# Patient Record
Sex: Male | Born: 1975 | Race: White | Hispanic: No | Marital: Married | Smoking: Never smoker
Health system: Southern US, Community
[De-identification: ages and names within clinical notes are randomized; demographics above are authoritative.]

## PROBLEM LIST (undated history)

## (undated) DIAGNOSIS — G4700 Insomnia, unspecified: Secondary | ICD-10-CM

## (undated) DIAGNOSIS — I10 Essential (primary) hypertension: Secondary | ICD-10-CM

## (undated) DIAGNOSIS — H5440 Blindness, one eye, unspecified eye: Secondary | ICD-10-CM

## (undated) DIAGNOSIS — F909 Attention-deficit hyperactivity disorder, unspecified type: Secondary | ICD-10-CM

## (undated) DIAGNOSIS — K219 Gastro-esophageal reflux disease without esophagitis: Secondary | ICD-10-CM

## (undated) HISTORY — DX: Gastro-esophageal reflux disease without esophagitis: K21.9

## (undated) HISTORY — DX: Insomnia, unspecified: G47.00

## (undated) HISTORY — PX: EYE SURGERY: SHX253

## (undated) HISTORY — DX: Essential (primary) hypertension: I10

## (undated) HISTORY — DX: Attention-deficit hyperactivity disorder, unspecified type: F90.9

---

## 2001-08-03 HISTORY — PX: ANKLE SURGERY: SHX546

## 2011-11-12 ENCOUNTER — Observation Stay: Payer: Self-pay | Admitting: Internal Medicine

## 2011-11-12 LAB — URINALYSIS, COMPLETE
Bacteria: NONE SEEN
Bilirubin,UR: NEGATIVE
Blood: NEGATIVE
Glucose,UR: NEGATIVE mg/dL (ref 0–75)
Nitrite: NEGATIVE
Ph: 6 (ref 4.5–8.0)
Protein: 100

## 2011-11-12 LAB — DRUG SCREEN, URINE
Amphetamines, Ur Screen: NEGATIVE (ref ?–1000)
Barbiturates, Ur Screen: NEGATIVE (ref ?–200)
Benzodiazepine, Ur Scrn: NEGATIVE (ref ?–200)
MDMA (Ecstasy)Ur Screen: NEGATIVE (ref ?–500)
Methadone, Ur Screen: NEGATIVE (ref ?–300)
Phencyclidine (PCP) Ur S: NEGATIVE (ref ?–25)

## 2011-11-12 LAB — ETHANOL
Ethanol %: <0.003 % (ref 0.000–0.080)
Ethanol: <3 mg/dL

## 2011-11-12 LAB — CBC
HCT: 48.1 % (ref 40.0–52.0)
MCH: 27.8 pg (ref 26.0–34.0)
MCHC: 32.9 g/dL (ref 32.0–36.0)
MCV: 84 fL (ref 80–100)
Platelet: 255 10*3/uL (ref 150–440)
RBC: 5.7 10*6/uL (ref 4.40–5.90)
RDW: 15.7 % — ABNORMAL HIGH (ref 11.5–14.5)

## 2011-11-12 LAB — CK TOTAL AND CKMB (NOT AT ARMC)
CK, Total: 465 U/L — ABNORMAL HIGH (ref 35–232)
CK-MB: 2.4 ng/mL (ref 0.5–3.6)

## 2011-11-12 LAB — COMPREHENSIVE METABOLIC PANEL
Albumin: 3.8 g/dL (ref 3.4–5.0)
Alkaline Phosphatase: 46 U/L — ABNORMAL LOW (ref 50–136)
Anion Gap: 13 (ref 7–16)
BUN: 9 mg/dL (ref 7–18)
Bilirubin,Total: 0.5 mg/dL (ref 0.2–1.0)
Calcium, Total: 8.9 mg/dL (ref 8.5–10.1)
Co2: 21 mmol/L (ref 21–32)
EGFR (African American): >60
Osmolality: 275 (ref 275–301)
SGOT(AST): 34 U/L (ref 15–37)
Sodium: 139 mmol/L (ref 136–145)

## 2011-11-12 LAB — TSH: Thyroid Stimulating Horm: <0.01 u[IU]/mL — ABNORMAL LOW

## 2011-11-12 LAB — PROTIME-INR
INR: 0.9
Prothrombin Time: 12.6 secs (ref 11.5–14.7)

## 2011-11-12 LAB — TROPONIN I: Troponin-I: 0.02 ng/mL

## 2011-11-12 LAB — CK-MB: CK-MB: 3.9 ng/mL — ABNORMAL HIGH (ref 0.5–3.6)

## 2011-11-13 LAB — CK-MB: CK-MB: 3.7 ng/mL — ABNORMAL HIGH (ref 0.5–3.6)

## 2011-11-13 LAB — HEMOGLOBIN A1C: Hemoglobin A1C: 4.8 % (ref 4.2–6.3)

## 2011-11-13 LAB — CK: CK, Total: 1087 U/L — ABNORMAL HIGH (ref 35–232)

## 2014-03-12 ENCOUNTER — Emergency Department (HOSPITAL_COMMUNITY): Payer: No Typology Code available for payment source

## 2014-03-12 ENCOUNTER — Emergency Department (HOSPITAL_COMMUNITY)
Admission: EM | Admit: 2014-03-12 | Discharge: 2014-03-12 | Disposition: A | Discharge status: Home / Self Care | Payer: Self-pay | Attending: Emergency Medicine | Admitting: Emergency Medicine

## 2014-03-12 ENCOUNTER — Encounter (HOSPITAL_COMMUNITY): Payer: Self-pay | Admitting: Emergency Medicine

## 2014-03-12 ENCOUNTER — Emergency Department (HOSPITAL_COMMUNITY): Payer: Self-pay

## 2014-03-12 DIAGNOSIS — Y9389 Activity, other specified: Secondary | ICD-10-CM | POA: Insufficient documentation

## 2014-03-12 DIAGNOSIS — H5440 Blindness, one eye, unspecified eye: Secondary | ICD-10-CM | POA: Insufficient documentation

## 2014-03-12 DIAGNOSIS — S199XXA Unspecified injury of neck, initial encounter: Principal | ICD-10-CM

## 2014-03-12 DIAGNOSIS — IMO0002 Reserved for concepts with insufficient information to code with codable children: Secondary | ICD-10-CM | POA: Insufficient documentation

## 2014-03-12 DIAGNOSIS — S0993XA Unspecified injury of face, initial encounter: Secondary | ICD-10-CM | POA: Insufficient documentation

## 2014-03-12 DIAGNOSIS — M542 Cervicalgia: Secondary | ICD-10-CM

## 2014-03-12 DIAGNOSIS — Y92410 Unspecified street and highway as the place of occurrence of the external cause: Secondary | ICD-10-CM | POA: Insufficient documentation

## 2014-03-12 DIAGNOSIS — R1084 Generalized abdominal pain: Secondary | ICD-10-CM | POA: Insufficient documentation

## 2014-03-12 DIAGNOSIS — S3981XA Other specified injuries of abdomen, initial encounter: Secondary | ICD-10-CM | POA: Insufficient documentation

## 2014-03-12 DIAGNOSIS — S0990XA Unspecified injury of head, initial encounter: Secondary | ICD-10-CM | POA: Insufficient documentation

## 2014-03-12 HISTORY — DX: Blindness, one eye, unspecified eye: H54.40

## 2014-03-12 MED ORDER — IBUPROFEN 800 MG PO TABS
800.0000 mg | ORAL_TABLET | Freq: Once | ORAL | Status: AC
  Administered 2014-03-12: 800 mg via ORAL
  Filled 2014-03-12: qty 1

## 2014-03-12 MED ORDER — FENTANYL CITRATE 0.05 MG/ML IJ SOLN
50.0000 ug | INTRAMUSCULAR | Status: DC | PRN
  Administered 2014-03-12: 50 ug via INTRAVENOUS
  Filled 2014-03-12: qty 2

## 2014-03-12 MED ORDER — IOHEXOL 300 MG/ML  SOLN
100.0000 mL | Freq: Once | INTRAMUSCULAR | Status: AC | PRN
  Administered 2014-03-12: 100 mL via INTRAVENOUS

## 2014-03-12 MED ORDER — NAPROXEN 375 MG PO TABS: 375.0000 mg | ORAL_TABLET | Freq: Two times a day (BID) | ORAL | Status: DC

## 2014-03-12 MED ORDER — SODIUM CHLORIDE 0.9 % IV BOLUS (SEPSIS)
1000.0000 mL | Freq: Once | INTRAVENOUS | Status: AC
  Administered 2014-03-12: 1000 mL via INTRAVENOUS

## 2014-03-12 NOTE — Discharge Instructions (Signed)
If you were given medicines take as directed.  If you are on coumadin or contraceptives realize their levels and effectiveness is altered by many different medicines.  If you have any reaction (rash, tongues swelling, other) to the medicines stop taking and see a physician.   Please follow up as directed and return to the ER or see a physician for new or worsening symptoms.  Thank you. Filed Vitals:   03/12/14 1000 03/12/14 1006 03/12/14 1015 03/12/14 1030  BP: 156/91  143/96 159/95  Pulse: 77  73 74  Temp:      TempSrc:      Resp:      SpO2: 96% 98% 94% 95%

## 2014-03-12 NOTE — ED Notes (Signed)
MD at bedside. 

## 2014-03-12 NOTE — ED Notes (Signed)
Patient transported to CT 
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
## 2014-07-29 ENCOUNTER — Encounter (HOSPITAL_COMMUNITY): Payer: Self-pay | Admitting: Emergency Medicine

## 2014-07-29 ENCOUNTER — Emergency Department (HOSPITAL_COMMUNITY)
Admission: EM | Admit: 2014-07-29 | Discharge: 2014-07-29 | Disposition: A | Discharge status: Home / Self Care | Payer: Self-pay | Attending: Emergency Medicine | Admitting: Emergency Medicine

## 2014-07-29 ENCOUNTER — Emergency Department (HOSPITAL_COMMUNITY): Payer: No Typology Code available for payment source

## 2014-07-29 DIAGNOSIS — Z791 Long term (current) use of non-steroidal anti-inflammatories (NSAID): Secondary | ICD-10-CM | POA: Insufficient documentation

## 2014-07-29 DIAGNOSIS — R464 Slowness and poor responsiveness: Secondary | ICD-10-CM | POA: Insufficient documentation

## 2014-07-29 DIAGNOSIS — R05 Cough: Secondary | ICD-10-CM | POA: Insufficient documentation

## 2014-07-29 DIAGNOSIS — H5442 Blindness, left eye, normal vision right eye: Secondary | ICD-10-CM | POA: Insufficient documentation

## 2014-07-29 DIAGNOSIS — R509 Fever, unspecified: Secondary | ICD-10-CM | POA: Insufficient documentation

## 2014-07-29 DIAGNOSIS — Z970 Presence of artificial eye: Secondary | ICD-10-CM | POA: Insufficient documentation

## 2014-07-29 DIAGNOSIS — R109 Unspecified abdominal pain: Secondary | ICD-10-CM | POA: Insufficient documentation

## 2014-07-29 DIAGNOSIS — J3489 Other specified disorders of nose and nasal sinuses: Secondary | ICD-10-CM | POA: Insufficient documentation

## 2014-07-29 DIAGNOSIS — R4189 Other symptoms and signs involving cognitive functions and awareness: Secondary | ICD-10-CM

## 2014-07-29 DIAGNOSIS — R5383 Other fatigue: Secondary | ICD-10-CM | POA: Insufficient documentation

## 2014-07-29 LAB — CBC WITH DIFFERENTIAL/PLATELET
BASOS PCT: 0 % (ref 0–1)
Basophils Absolute: 0 10*3/uL (ref 0.0–0.1)
Eosinophils Absolute: 0.2 10*3/uL (ref 0.0–0.7)
Eosinophils Relative: 1 % (ref 0–5)
HCT: 45.6 % (ref 39.0–52.0)
Hemoglobin: 14.5 g/dL (ref 13.0–17.0)
Lymphocytes Relative: 7 % — ABNORMAL LOW (ref 12–46)
Lymphs Abs: 1 10*3/uL (ref 0.7–4.0)
MCH: 26.1 pg (ref 26.0–34.0)
MCHC: 31.8 g/dL (ref 30.0–36.0)
MCV: 82 fL (ref 78.0–100.0)
Monocytes Absolute: 1.2 10*3/uL — ABNORMAL HIGH (ref 0.1–1.0)
Monocytes Relative: 8 % (ref 3–12)
NEUTROS ABS: 12 10*3/uL — AB (ref 1.7–7.7)
NEUTROS PCT: 84 % — AB (ref 43–77)
Platelets: 345 10*3/uL (ref 150–400)
RBC: 5.56 MIL/uL (ref 4.22–5.81)
RDW: 13.9 % (ref 11.5–15.5)
WBC: 14.4 10*3/uL — ABNORMAL HIGH (ref 4.0–10.5)

## 2014-07-29 LAB — COMPREHENSIVE METABOLIC PANEL
ALT: 20 U/L (ref 0–53)
AST: 32 U/L (ref 0–37)
Albumin: 3.7 g/dL (ref 3.5–5.2)
Alkaline Phosphatase: 42 U/L (ref 39–117)
Anion gap: 8 (ref 5–15)
BUN: 6 mg/dL (ref 6–23)
CHLORIDE: 100 meq/L (ref 96–112)
CO2: 29 mmol/L (ref 19–32)
Calcium: 9.1 mg/dL (ref 8.4–10.5)
Creatinine, Ser: 1.53 mg/dL — ABNORMAL HIGH (ref 0.50–1.35)
GFR, EST AFRICAN AMERICAN: 66 mL/min — AB (ref >90)
GFR, EST NON AFRICAN AMERICAN: 57 mL/min — AB (ref >90)
GLUCOSE: 79 mg/dL (ref 70–99)
Potassium: 3.7 mmol/L (ref 3.5–5.1)
SODIUM: 137 mmol/L (ref 135–145)
Total Bilirubin: 0.2 mg/dL — ABNORMAL LOW (ref 0.3–1.2)
Total Protein: 7.4 g/dL (ref 6.0–8.3)

## 2014-07-29 LAB — ETHANOL: Alcohol, Ethyl (B): <5 mg/dL (ref 0–9)

## 2014-07-29 LAB — SALICYLATE LEVEL: Salicylate Lvl: 6.6 mg/dL (ref 2.8–20.0)

## 2014-07-29 LAB — ACETAMINOPHEN LEVEL

## 2014-07-29 LAB — CBG MONITORING, ED: Glucose-Capillary: 70 mg/dL (ref 70–99)

## 2014-07-29 MED ORDER — NALOXONE HCL 0.4 MG/ML IJ SOLN
0.4000 mg | Freq: Once | INTRAMUSCULAR | Status: AC
  Administered 2014-07-29: 0.4 mg via INTRAVENOUS
  Filled 2014-07-29: qty 1

## 2014-07-29 NOTE — ED Notes (Signed)
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

## 2014-07-29 NOTE — ED Notes (Addendum)
Patient walked to bathroom to try to urinate for sample.

## 2014-07-29 NOTE — ED Notes (Signed)
Patient continues to be unable to give UA.  MD notified.

## 2014-07-29 NOTE — Discharge Instructions (Signed)
Fatigue °Fatigue is a feeling of tiredness, lack of energy, lack of motivation, or feeling tired all the time. Having enough rest, good nutrition, and reducing stress will normally reduce fatigue. Consult your caregiver if it persists. The nature of your fatigue will help your caregiver to find out its cause. The treatment is based on the cause.  °CAUSES  °There are many causes for fatigue. Most of the time, fatigue can be traced to one or more of your habits or routines. Most causes fit into one or more of three general areas. They are: °Lifestyle problems °· Sleep disturbances. °· Overwork. °· Physical exertion. °· Unhealthy habits. °· Poor eating habits or eating disorders. °· Alcohol and/or drug use . °· Lack of proper nutrition (malnutrition). °Psychological problems °· Stress and/or anxiety problems. °· Depression. °· Grief. °· Boredom. °Medical Problems or Conditions °· Anemia. °· Pregnancy. °· Thyroid gland problems. °· Recovery from major surgery. °· Continuous pain. °· Emphysema or asthma that is not well controlled °· Allergic conditions. °· Diabetes. °· Infections (such as mononucleosis). °· Obesity. °· Sleep disorders, such as sleep apnea. °· Heart failure or other heart-related problems. °· Cancer. °· Kidney disease. °· Liver disease. °· Effects of certain medicines such as antihistamines, cough and cold remedies, prescription pain medicines, heart and blood pressure medicines, drugs used for treatment of cancer, and some antidepressants. °SYMPTOMS  °The symptoms of fatigue include:  °· Lack of energy. °· Lack of drive (motivation). °· Drowsiness. °· Feeling of indifference to the surroundings. °DIAGNOSIS  °The details of how you feel help guide your caregiver in finding out what is causing the fatigue. You will be asked about your present and past health condition. It is important to review all medicines that you take, including prescription and non-prescription items. A thorough exam will be done.  You will be questioned about your feelings, habits, and normal lifestyle. Your caregiver may suggest blood tests, urine tests, or other tests to look for common medical causes of fatigue.  °TREATMENT  °Fatigue is treated by correcting the underlying cause. For example, if you have continuous pain or depression, treating these causes will improve how you feel. Similarly, adjusting the dose of certain medicines will help in reducing fatigue.  °HOME CARE INSTRUCTIONS  °· Try to get the required amount of good sleep every night. °· Eat a healthy and nutritious diet, and drink enough water throughout the day. °· Practice ways of relaxing (including yoga or meditation). °· Exercise regularly. °· Make plans to change situations that cause stress. Act on those plans so that stresses decrease over time. Keep your work and personal routine reasonable. °· Avoid street drugs and minimize use of alcohol. °· Start taking a daily multivitamin after consulting your caregiver. °SEEK MEDICAL CARE IF:  °· You have persistent tiredness, which cannot be accounted for. °· You have fever. °· You have unintentional weight loss. °· You have headaches. °· You have disturbed sleep throughout the night. °· You are feeling sad. °· You have constipation. °· You have dry skin. °· You have gained weight. °· You are taking any new or different medicines that you suspect are causing fatigue. °· You are unable to sleep at night. °· You develop any unusual swelling of your legs or other parts of your body. °SEEK IMMEDIATE MEDICAL CARE IF:  °· You are feeling confused. °· Your vision is blurred. °· You feel faint or pass out. °· You develop severe headache. °· You develop severe abdominal, pelvic, or   back pain.  You develop chest pain, shortness of breath, or an irregular or fast heartbeat.  You are unable to pass a normal amount of urine.  You develop abnormal bleeding such as bleeding from the rectum or you vomit blood.  You have thoughts  about harming yourself or committing suicide.  You are worried that you might harm someone else. MAKE SURE YOU:   Understand these instructions.  Will watch your condition.  Will get help right away if you are not doing well or get worse. Document Released: 05/17/2007 Document Revised: 10/12/2011 Document Reviewed: 11/21/2013 [facility] Patient Information 2015 [place], [place]. This information is not intended to replace advice given to you by your health care provider. Make sure you discuss any questions you have with your health care provider.  Drug Toxicity You are having a reaction to your medicine. This does not mean you are allergic to the drug. Medicines can have many different side effects and toxic reactions. These include:  Stomach symptoms, such as nausea, vomiting, cramps, diarrhea, bloating, constipation, and dry mouth.  Nervous system symptoms, such as weakness, muscle spasms, drowsiness, confusion, agitation, and headache.  Heart and blood vessel symptoms, such as fainting, irregular heartbeat (palpitations), and fast heartbeat.  Skin symptoms, such as itching, light sensitivity, and rashes. When taking more medicines, there is an increased chance of a drug interaction that can make you sick. Take your medicines as your caregiver recommends. Keep a list of the names and doses of each of your drugs. Avoid alcohol and street drugs. Call your caregiver if you are worried about drug side effects or toxicity. Document Released: 07/20/2005 Document Revised: 10/12/2011 Document Reviewed: 01/04/2007 [facility] Patient Information 2015 [place], [place]. This information is not intended to replace advice given to you by your health care provider. Make sure you discuss any questions you have with your health care provider.
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
## 2014-07-29 NOTE — ED Provider Notes (Signed)
CSN: [id number]     Arrival date & time 07/29/14  0044 History  This chart was scribed for Audree CamelScott T Antario Yasuda, MD by Abel PrestoKara Demonbreun, ED Scribe. This patient was seen in room [facility] and the patient's care was started at 12:54 AM.    Chief Complaint  Patient presents with  . Abnormal ECG    The history is provided by the patient. No language interpreter was used.   HPI Comments: HPI Comments: Dylan Page is a 38 y.o. male brought in by ambulance, who presents to the Emergency Department complaining of fatigue. Pt states his wife found him on the floor of his closet.  He is not sure how he got there. Pt states he took 0.5 mg of Xanax before bed. Pt notes he has been sick with cough, congestion, rhinorrhea, abdominal pain, and fever between 104-105.  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  Pt denies headaches.  Past Medical History  Diagnosis Date  . Blind left eye    Past Surgical History  Procedure Laterality Date  . Eye surgery     No family history on file. History  Substance Use Topics  . Smoking status: Never Smoker   . Smokeless tobacco: Not on file  . Alcohol Use: No    Review of Systems  Constitutional: Positive for fever.  HENT: Positive for congestion and rhinorrhea.   Respiratory: Positive for cough.   Gastrointestinal: Positive for abdominal pain.  Neurological: Negative for headaches.  All other systems reviewed and are negative.     Allergies  Review of patient's allergies indicates no known allergies.  Home Medications   Prior to Admission medications   Medication Sig Start Date End Date Taking? Authorizing Provider  naproxen (NAPROSYN) 375 MG tablet Take 1 tablet (375 mg total) by mouth 2 (two) times daily. 03/12/14   Enid SkeensJoshua M Zavitz, MD   BP 148/66 mmHg  Pulse 89  Temp(Src) 97.9 F (36.6 C) (Oral)  Resp 18  SpO2 100% Physical Exam  Constitutional: He is oriented to person, place, and time. He appears well-developed and well-nourished. He appears  lethargic.  HENT:  Head: Normocephalic and atraumatic.  Right Ear: External ear normal.  Left Ear: External ear normal.  Nose: Nose normal.  Eyes: EOM are normal. Right pupil is reactive (3 mm).  Left eye is prosthetic  Neck: Normal range of motion. Neck supple.  Cardiovascular: Normal rate, regular rhythm and normal heart sounds.  Exam reveals no friction rub.   No murmur heard. Pulmonary/Chest: Effort normal and breath sounds normal. No respiratory distress. He has no wheezes. He has no rales.  Abdominal: Soft. Bowel sounds are normal. There is no tenderness. There is no rebound.  Musculoskeletal: Normal range of motion.  Neurological: He is oriented to person, place, and time. He appears lethargic.  Normal strength in all 4 extremities. Patient is oriented x 4 when awoken, appears lethargic  Skin: Skin is warm and dry. He is not diaphoretic.  Psychiatric: He has a normal mood and affect. His behavior is normal.  Nursing note and vitals reviewed.   ED Course  Procedures (including critical care time) DIAGNOSTIC STUDIES: Oxygen Saturation is 100% on room air, normal by my interpretation.    COORDINATION OF CARE: 1:00 AM Discussed treatment plan with patient at beside, the patient agrees with the plan and has no further questions at this time.   Labs Review Labs Reviewed  COMPREHENSIVE METABOLIC PANEL - Abnormal; Notable for the following:    Creatinine, Ser  1.53 (*)    Total Bilirubin 0.2 (*)    GFR calc non Af Amer 57 (*)    GFR calc Af Amer 66 (*)    All other components within normal limits  CBC WITH DIFFERENTIAL - Abnormal; Notable for the following:    WBC 14.4 (*)    Neutrophils Relative % 84 (*)    Neutro Abs 12.0 (*)    Lymphocytes Relative 7 (*)    Monocytes Absolute 1.2 (*)    All other components within normal limits  ACETAMINOPHEN LEVEL - Abnormal; Notable for the following:    Acetaminophen (Tylenol), Serum <10.0 (*)    All other components within normal  limits  ETHANOL  SALICYLATE LEVEL  URINALYSIS, ROUTINE W REFLEX MICROSCOPIC  URINE RAPID DRUG SCREEN (HOSP PERFORMED)  CBG MONITORING, ED    Imaging Review Dg Chest Port 1 View  07/29/2014   CLINICAL DATA:  Acute onset of cough, fever, congestion, loss of consciousness and fatigue. Initial encounter.  EXAM: PORTABLE CHEST - 1 VIEW  COMPARISON:  Chest radiograph and CT of the chest performed 03/12/2014  FINDINGS: The lungs are well-aerated. Mild vascular congestion is noted. Minimal bibasilar atelectasis is seen. There is no evidence of pleural effusion or pneumothorax.  The cardiomediastinal silhouette is borderline normal in size. No acute osseous abnormalities are seen.  IMPRESSION: Minimal bibasilar atelectasis seen; mild vascular congestion noted.   Electronically Signed   By: Roanna RaiderJeffery  Chang M.D.   On: 07/29/2014 01:59     EKG Interpretation   Date/Time:  Sunday July 29 2014 00:55:57 EST Ventricular Rate:  96 PR Interval:  165 QRS Duration: 110 QT Interval:  379 QTC Calculation: 479 R Axis:   27 Text Interpretation:  Sinus rhythm Probable left atrial enlargement  Incomplete left bundle branch block Left ventricular hypertrophy Anterior  ST elevation, probably due to LVH Borderline prolonged QT interval No old  tracing to compare Confirmed by Reiley Bertagnolli  MD, Prim Morace ([id number]) on 07/29/2014  1:01:32 AM      MDM   Final diagnoses:  Decreased responsiveness   Patient is protecting airway but lethargic on arrival. EMS concerned about EKG but no signs of STEMI, and patient is not and has not had any chest pain. Wife arrived and stated she was unable to arouse patient which is why she called EMS. Patient has been up a lot recently and ill with URI symptoms, and then took mucinex, xanax and melatonin. His lethargy is likely from polypharmacy in this situation. He also told nurse when I was out of room that he did cocaine 1 day ago, could be coming down from a high. Patient unable to  urinate in ED, he and wife state this is normal for him due to anxiety with urinating with people around. Declines workup for retention or need for catheter. He is more awake here after being observed, and is stable for discharge  I personally performed the services described in this documentation, which was scribed in my presence. The recorded information has been reviewed and is accurate.     Audree CamelScott T Jordain Radin, MD 07/29/14 [phone]

## 2014-07-29 NOTE — ED Notes (Signed)
Patient asked if he could stand to give a urine sample.

## 2014-11-25 NOTE — Consult Note (Signed)
PATIENT NAME:  Dylan Page, Dylan Page MR#:  [id number] DATE OF BIRTH:  02-12-1976  DATE OF CONSULTATION:  11/13/2011  REFERRING PHYSICIAN:  Camillo FlamingKamran Lateef, MD  CONSULTING PHYSICIAN:  A. Wendall MolaMelissa Solum, MD  CHIEF COMPLAINT: Abnormal thyroid function tests.   HISTORY OF PRESENT ILLNESS: This is a 39 year old male who was admitted yesterday after a syncopal episode. 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 he had syncope with preceding symptoms of blurred vision and lightheadedness. He was brought to the ED and found to have stable vital signs. His initial labs were notable for a low TSH of less than 0.010. He had a negative noncontrast head CT and a negative chest x-ray. Additional labs, including a urine drug screen, was negative. He was admitted for observation. Subsequent labs also showed a low free and total T4 with an elevated free T3. He has been taking T3 supplementation for several months. He estimates that he takes 25 to 50 mcg per day. In addition, he takes testosterone IM 800 to 1000 mg 1 to 2 times per week for body building effects.  Neither of these medications are prescribed, rather they are bought from friends. He does intend to stop the thyroid hormone supplementation. Currently he has no complaints. He denies any heart racing or palpitation. He denies any tremor. He denies any heat intolerance. He denies any known history of thyroid disease. He denies any neck pain or discomfort. He denies lightheadedness.   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   PAST SURGICAL HISTORY:  1. Right ankle open reduction internal fixation.  2. Left eye surgery.   OUTPATIENT MEDICATIONS:  1. T3, 25 to 50 mcg per day.  2. Testosterone injections 800 to 1000 mg, 1 to 2 times weekly.  3. Goody Powders, often 4 to 6 daily for headache.  4. Dietary supplements.   ALLERGIES: No known drug allergies.   FAMILY HISTORY: He denies known  family history of thyroid disease.  SOCIAL HISTORY: He is married, has one child. He is employed as a Designer, fashion/clothingroofer. He denies tobacco use. Occasional alcohol use.    REVIEW OF SYSTEMS: GENERAL: No recent weight change. No fever. HEENT: No blurred vision. No sore throat. NECK: No neck pain. No dysphagia. CARDIAC: No chest pain. No palpitations. PULMONARY: No cough, no shortness of breath. ABDOMEN: No abdominal pain. Normal appetite. HEMATOLOGIC: Denies easy bruising or recent bleeding. GU: Denies dysuria or hematuria. ENDOCRINE: Denies heat or cold intolerance. SKIN: Denies rash or pruritus. MUSCULOSKELETAL: Denies weakness. Does complain of chronic back pain. Denies leg swelling.   PHYSICAL EXAMINATION:  VITAL SIGNS: Temperature 98.3, pulse 95, respiratory rate 20, blood pressure 157/102, pulse oximetry 98% on room air.   GENERAL: A quite muscular, tanned, middle-aged male in no distress.   HEENT: Prior left eye surgery with obvious clouding of cornea. No proptosis. Oropharynx is clear.    NECK: Supple. No thyromegaly. Thyroid is palpable and nontender. No palpable thyroid nodules.   CARDIAC: Regular rate and rhythm. No carotid bruit.   PULMONARY: Clear to auscultation bilaterally. No wheeze.   ABDOMEN: Diffusely soft, nontender.   EXTREMITIES: No edema is present. Increased muscular build throughout. Normal motor tone.   SKIN: No rash. Diffuse tattoos are present on the trunk and extremities.   PSYCHIATRIC: Alert and oriented, calm and cooperative.   LABS/STUDIES: Pertinent labs as per history of present illness.   ASSESSMENT:  1. 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  2. Exogenous testosterone abuse.   RECOMMENDATIONS:  1. Do not take T3. I cautioned the patient about significant health risks associated with hyperthyroidism to include potential tachyarrhythmias and heart failure.  2. Recommend he taper down and off the testosterone.   3. He needs to follow regularly with his PCP.  4. He should have repeat thyroid function tests in 4 to 6 weeks to ensure they have normalized.  5. He should discuss with his PCP the need for an assessment for hypogonadism, if indicated, after a period of time completely off testosterone, preferably 2 to 3 months.  6. Hyperthyroidism could have contributed to the episode of syncope, difficult to know;  however, if so, this should not occur if he stops his T3 as recommended.   Thank you for the kind request for consultation.   ____________________________ A. Wendall Mola, MD ams:cbb D: 11/13/2011 15:34:19 ET T: 11/14/2011 12:33:59 ET JOB#: [id number]  cc: A. Wendall Mola, MD, <Dictator> Lucila Maine, MD, [facility] in [place], [place] Macy Mis MD ELECTRONICALLY SIGNED 11/15/2011 12:42

## 2014-11-25 NOTE — Consult Note (Signed)
Consult dictated,Patient is 39 yo male has syncopal episode while climbing roof top, and fell out. His EKG just has sinus tachycardia, mildly elevated troponin due to rhabdomylysis and fall. Advise discharge with f/u me next week monday 10 am.  Electronic Signatures: Radene KneeKhan, Shaukat Ali (MD)  (Signed on 12-Apr-13 08:11)  Authored  Last Updated: 12-Apr-13 08:11 by Radene KneeKhan, Shaukat Ali (MD)

## 2014-11-25 NOTE — Discharge Summary (Signed)
PATIENT NAME:  Dylan Page, Dylan Page MR#:  [id number] DATE OF BIRTH:  [birth year]  DATE OF ADMISSION:  11-28-2011 DATE OF DISCHARGE:  11/13/2011  ADMITTING DIAGNOSIS: Syncope, likely vasovagal in nature.   DISCHARGE DIAGNOSES:  1. Syncope, possibly vasovagal.  2. Elevated troponin.  3. Normal echocardiogram and normal carotid ultrasound.  4. 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  5. Rhabdomyolysis, again likely related to hyperthyroidism.   DISCHARGE CONDITION: Stable.   DISCHARGE MEDICATION: The patient is to resume his outpatient medication which is Zyrtec 10 mg p.o. daily.   ADDITIONAL MEDICATION: Norvasc 5 mg a day.   DIET: 2 grams salt.  Activity Limitations: As tolerated.   DISCHARGE FOLLOWUP: Follow-up with Dr. Esperanza Sheets in 3 days after discharge as well as Dr. Adrian Blackwater in 3 days after discharge.   CONSULTANTS:  1. Adrian Blackwater, MD. 2. Wendall Mola, MD. 3. Social Work.  DISCHARGE INSTRUCTIONS: The patient was also advised to taper off his thyroid medications as well as any other medications he was using over-the-counter.   RADIOLOGIC STUDIES: Chest PA and lateral on 11/28/2011 showed no acute changes.   CT of the head without contrast, on 2011-11-28, showed no acute intracranial process.   Ultrasound of carotid arteries bilaterally, on 11/13/2011, revealed no hemodynamically significant stenosis on either side. No calcific plaque formation on either side. Antegrade flow in both vertebrals was noted.   Echocardiogram on 11-28-11 revealed normal chamber size and function with mild mitral regurgitation as well as mild pulmonary hypertension.   HISTORY OF PRESENT ILLNESS: The patient is a 39 year old Caucasian male with past medical history significant for history of allergies as well as left eye blindness since age 77 who presented to the hospital with complaints of passing out. Please refer to Dr. Doreatha Martin admission note from November 28, 2011.   On arrival  to the hospital, the patient's vitals showed a temperature of 97.6, pulse 104, respiratory rate 18, blood pressure 153/77, and saturation 98% on room air. Physical examination was unremarkable.   LABS/STUDIES: Labs done on November 28, 2011 showed normal BMP. Alcohol less than 3. Liver enzymes were also normal. Cardiac enzymes showed CK total of 465, otherwise MB fraction and troponin on the first were normal. The second set however showed elevation of CK-MB to 3.9 and troponin level was up to 0.19. On the third set, CK total was 1388, MB fraction was 3.7, and troponin was declining to 0.09. The patient's TSH was found to be low at less than 0.01. Urine drug screen was negative. CBC was within normal limits. Coagulation panel was normal. D-dimer was normal at 0.4.   Urinalysis was unremarkable.   HOSPITAL COURSE:  The patient was admitted to the hospital, observation unit. He was evaluated for syncope. He had had an echocardiogram done which was unremarkable. He had also carotid ultrasound, which was normal. It was felt that the patient's syncope could have been vasovagal; however, dehydration, hyperthyroidism related, was not completely ruled out despite the patient's orthostatic vital signs being completely within normal limits on admission to the emergency room. The patient was evaluated by Dr. Adrian Blackwater due to elevation of troponin, on the second set. Dr. Welton Flakes admitted that the patient's EKG showed sinus tachycardia and mildly elevated troponin was very likely due to rhabdomyolysis as well as fall. He recommended to discharge and follow up with Dr. Adrian Blackwater on Monday at 10:00 a.m., in three days after discharge. The patient was also seen by Dr. Tedd Sias due to hyperthyroidism. She felt that  the patient has hyperthyroidism due to abuse of T3 as well as testosterone abuse.  The patient was counseled to stop T3. She recommended to have the patient's thyroid function tests repeated by a primary care physician in  four weeks. She also discussed adverse effects of hyperthyroidism. We counseled the patient to taper down and off testosterone and have testosterone levels checked by a PCP. She also discussed the adverse effects of excessive testosterone. The patient voiced understanding of the issues and made the decision to proceed. On 11/13/2011, he was complaining of back pain and again he had a discussion with the discharging physician about back pain issues. He requested some opioids, however, no opioids were given to him. He was noted to have elevated blood pressure, as high as around 157 systolic and diastolic to 102. He was started on Norvasc for hypertension. He is to follow up with his primary care physician and advance his blood pressure medications as needed to control his blood pressure.   In regards to rhabdomyolysis, the patient was given IV fluids. It was felt that the patient's mild rhabdomyolysis was related to his fall. His maximum level of CK total was at 1388. It decreased with IV fluid administration to 1087 by 11/13/2011. The patient is being discharged in stable condition with the above-mentioned medications and follow-up. His vitals are temperature 98.3, pulse 95, respiration rate 20, blood pressure 155/80, and saturation 98% on room air at rest.   TIME SPENT: 40 minutes. ____________________________ Katharina Caperima Monique Gift, MD rv:slb D: 11/13/2011 17:17:59 ET T: 11/16/2011 09:44:55 ET JOB#: [id number]  cc: Katharina Caperima Delila Kuklinski, MD, <Dictator> Laurier NancyShaukat A. Khan, MD Esperanza SheetsEric Turner, MD Trase Bunda MD ELECTRONICALLY SIGNED 11/24/2011 20:05

## 2014-11-25 NOTE — H&P (Signed)
PATIENT NAME:  Dylan Page, Dylan Page MR#:  [id number] DATE OF BIRTH:  [birth year]  DATE OF ADMISSION:  11/12/2011  REFERRING PHYSICIAN:   Glennie Isle, MD   PRIMARY CARE PHYSICIAN: Dr. Lorin Picket, [facility]  CHIEF COMPLAINT: "I passed out."   HISTORY OF PRESENT ILLNESS: The patient is a pleasant 39 year old male without significant past medical history other than legal left eye blindness since age 67 who presents to the Emergency Department with the above-mentioned chief complaint. The patient works as a Designer, fashion/clothing. He states he was working on the roof yesterday and was sweating excessively and working out in the heat. He states he was not consuming as much fluids as he normally does. He went to work again today and around 9:00 a.m., while walking up a ladder attempting to climb onto the roof, he started to see spots and had some blurry vision and had mild dizziness and lightheadedness. He made it to the roof, and the next thing he remembers he was on his way to the hospital. He experienced a syncopal episode while he was on the roof of unknown duration. He was working with his crew, which consisted of three other members; and coworkers called EMS and the patient was brought to the ER for further evaluation. He denies any preceding diaphoresis or any chest pain or heart palpitations. He denies experiencing similar symptoms in the past. After he came to while he was with Emergency Medical Services, he states he felt a little nauseous and he vomited once on his way to the ER. Currently, he denies any abdominal pain, nausea or vomiting. Currently, he states he feels at his baseline and feels well. He feels as if he may have been a little dehydrated last night. He is currently not orthostatic. He was brought to the ER for further evaluation of syncope. Noncontrast head CT was obtained which was negative for acute intracranial abnormalities. A TSH was checked and was profoundly low at less than 0.010. The  patient admits to taking T3, approximately  25 to 50 mcg per day, and states he cycles on and off every summer and attempts to stay in shape. He has been using it for the past 5 to 6 years. Most recent cycling period he has been taking this for the past couple of months. He also admits to testosterone usage and injects it intramuscularly 2 to 4 times a week, and attempts to body build and lift weights, and has been using it for approximately 10 years on and off. He also admits to taking a supplement from [facility] called OxyElite, which is a dietary supplement pill and apparently is an appetite suppressant, and he takes this once a day. Of note, he also admits to taking Roxicodone once a week and he took it this past Monday. He takes it for back pain, and he also admits to occasional use of Suboxone on and off and last use was approximately a month ago but denies recent usage. All of these medications, he states, he receives from friends and gets these medications off the streets. Of note, he tells me that he does not want his family to know that he is taking these medications (other than T3) and requests that physicians involved in his care do not disclose his drug use to his family. Otherwise, he is without specific complaints at this time. Hospitalist Services were contacted for further evaluation.   PAST SURGICAL HISTORY:  1. Surgery and metal rod insertion into his ankle after a  fracture.  2. History of left eye surgery.  PAST MEDICAL HISTORY:  1. History of right ankle fracture, status post insertion of a metal rod and surgical repair.  2. Legal blindness in the left eye since age 39 after a piece of metal was lodged in his eye, requiring surgery.  ALLERGIES: No known drug allergies.   HOME MEDICATIONS:  1. T3 non prescribed use, takes 25 to 50 mcg per day in attempt to "stay in shape." He has been using it for the past couple of months, and cycles on and off every summer, and has been using it for 5 to  6 years.  2. Intramuscular testosterone injections, 2 to 4 times a week, has been using it on and off for 10 years. This is not prescribed.  3. Goody Powders p.r.n.  4. OxyElite dietary supplement pill from [facility] once a day, used for appetite suppressant purposes.  5. Zyrtec daily.  6. Admits to using Roxicodone once a week, took it this past Monday for back pain, approximately 15 mg per the patient.  7. Suboxone on and off, no recent use. He states he took it about a month ago.   NOTE: None of these medications are prescribed other than Zyrtec.    FAMILY HISTORY: Father is alive and healthy. Mother died of suicide.   SOCIAL HISTORY: Tobacco: None. Alcohol: Occasional on the weekends. Illicit drugs: None. The [place]. He has one child. He works as a Designer, fashion/clothing.   REVIEW OF SYSTEMS: CONSTITUTIONAL: Denies fevers, chills, recent changes in weight or weakness. Denies any pain. HEENT: Had some blurry vision before syncopal event but none currently. He denies tinnitus, earache, nasal discharge, or sore throat. RESPIRATORY: Denies shortness breath, cough, or wheeze. CARDIOVASCULAR: Denies chest pain, heart palpitations or lower extremity edema. GASTROINTESTINAL: Had some nausea and vomited once en route to the ER while with EMS, none since that time. Denies diarrhea, constipation, melena or hematochezia, abdominal pain. GENITOURINARY: Denies dysuria or hematuria. ENDOCRINE: Denies heat or cold intolerance. Denies increased sweating or thirst. Denies any thyroid problems in the past. HEME/LYMPH: Denies easy bruising or bleeding INTEGUMENTARY: Denies rashes or lesions. MUSCULOSKELETAL: Denies joint pain or back pain currently but has occasional back pain, none at this moment. NEUROLOGIC: Denies headache, numbness, weakness, tingling, or dysarthria. PSYCHIATRIC: Denies depression or anxiety.   PHYSICAL EXAMINATION:  VITAL SIGNS: Temperature 97.6, pulse 104, blood  pressure 153/77, respirations 18, oxygen saturation 98% on room air.   GENERAL: The patient is a well-built male, alert and oriented, not in any acute distress.   HEENT: Head: Normocephalic, atraumatic. Eyes: Pupils are equal, round, reactive to light and accommodation. Extraocular muscles are intact. Anicteric sclerae. Conjunctivae pink. Ears: Hearing intact to voice. Nares: Without drainage. Oral mucosa: Moist without lesions.  NECK: Supple with full range of motion. No jugular venous distention, lymphadenopathy, or carotid bruits bilaterally. No thyromegaly or tenderness to palpation over the thyroid gland. No palpable nodules.   CHEST: Normal respiratory effort without use of accessory respiratory muscles.   LUNGS: Clear to auscultation bilaterally without crackles, rales, or wheezes.   CARDIOVASCULAR: S1, S2 positive, tachycardic. No murmurs, rubs, or gallops. PMI is nonlateralized.   ABDOMEN: Soft, nontender, nondistended. Normoactive bowel sounds. No hepatosplenomegaly or palpable masses. No hernias.   EXTREMITIES: No clubbing, cyanosis, or edema. Pedal pulses are palpable bilaterally.  Extremities are non tremulous.  SKIN: No suspicious rashes. Skin turgor is good.   LYMPH: No cervical  lymphadenopathy.   NEUROLOGICAL: The patient is alert and oriented x3. Cranial nerves II through XII are grossly intact.  No focal deficits.  PSYCHIATRIC: Pleasant male with appropriate affect.   LABORATORY, DIAGNOSTIC AND RADIOLOGICAL DATA:  EKG reveals sinus tachycardia at 120 beats per minute, normal axis, normal intervals, nonspecific T wave abnormality. No acute ischemic changes seen. There are no prior EKGs for comparison in our system.  Noncontrast head CT: No acute intracranial abnormalities are noted.  Chest x-ray, PA and lateral: No acute cardiopulmonary abnormalities are noted.  CK 465, CK-MB 2.4. Troponin 0.02.  CBC within normal limits. Serum alcohol less than 0.003%.  Complete  metabolic panel within normal limits.  INR 0.9.  TSH is less than 0.010.  Urinalysis is pending.  Urine drug screen is negative.  D-dimer 0.40.   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 Syncope: Suspect vasovagal in nature. The patient is not orthostatic. There may be a possible element of mild volume depletion as he states he was working on the roof yesterday in the heat, and was sweating profusely and did not drink as much water as he normally does; and thereafter he went to work again today and experienced a brief syncopal episode. Currently, he feels well.  We will admit the patient under observation status. We will place the patient on continuous telemetry monitoring. Cycle his cardiac enzymes. Obtain 2-D echocardiogram and carotid artery Doppler ultrasound. Gently hydrate the patient with IV fluids. Noncontrast head CT was negative for acute intracranial abnormalities. Further work-up and management to follow depending on the patient's clinical course.  2. Hyperthyroidism: Suspect from exogenous T3 use. We will check FT3 and FT4 levels. I advised the patient to stop taking T3 supplement. He is currently tachycardic, and we will consider beta blocker usage. However, we will hold off further work-up or treatment for now until the patient is seen by Endocrinology, and we will obtain a formal Endocrinology consultation for further recommendations.  3. Mild rhabdomyolysis: This could be from fall after syncope versus muscle breakdown from weight lifting. He currently denies any myalgias. We will provide gentle hydration with IV fluids for now and follow CK levels closely.  4. Elevated blood pressure without diagnosed hypertension: This could be stress induced versus hyperthyroidism contributing because he is also somewhat tachycardic. Could consider beta  blocker usage, and we will write for p.r.n. IV labetalol if his blood pressure remains persistently uncontrolled, and we will monitor blood pressure closely.  5. Nonprescribed drug use: The patient was advised that he should only be taking medications that are prescribed to him. He was advised to not take testosterone injections unless these are prescribed to him by a physician. We also advised him to stop taking T3 unless this is prescribed by a physician. He was also advised to stop using OxyElite. He was also advised to not take Roxicodone or Suboxone unless prescribed by  physician. Urine drug screen currently is negative. The patient currently denies any depression or suicidal or homicidal ideation. He states he does not take these medications recreationally. Mostly he takes these medications in order to stay in shape, and he takes the Roxicodone and Suboxone to help with occasional back pain.  6. Deep vein thrombosis prophylaxis: The patient is ambulatory. We will place SCDs to his lower extremities while he is in bed.   CODE STATUS:  FULL CODE.    TIME SPENT ON ADMISSION: Approximately 50 minutes. ____________________________ Elon Alas, MD knl:cbb D: 11/12/2011 16:05:01 ET T: 11/12/2011 17:14:09 ET JOB#: [id number]  cc: Elon Alas, MD, <Dictator> Dr. [facility] [address] Rhayne Chatwin MD ELECTRONICALLY SIGNED 11/24/2011 17:50

## 2014-11-25 NOTE — Consult Note (Signed)
PATIENT NAME:  Dylan Page, Dylan Page MR#:  [id number] DATE OF BIRTH:  12-28-1975  DATE OF CONSULTATION:  11/13/2011  REFERRING PHYSICIAN:   CONSULTING PHYSICIAN:  Laurier NancyShaukat A. Natoya Viscomi, MD  HISTORY OF PRESENT ILLNESS: This is a 39 year old white male with a past medical history of being legally blind in the left eye since age 89 who presented to the Emergency Room with apparently passing out. He said he was on the roof and all of a sudden he started having blurred vision. When he was actually trying to climb the ladder to get to the roof he fell out. He felt lightheaded and dizzy with blurry vision, and then he just all of a sudden passed out. He was brought to the Emergency Room where he had a CT which was unremarkable. He denies prior history of having a seizure disorder. He denied any chest pain.   PAST MEDICAL HISTORY:  1. History of left eye surgery.  2. History of surgery with metal rod insertion in his ankle after a fracture. 3. History of hypertension.   MEDICATIONS:  1. Intramuscular testosterone injections. 2. Goody powders.  4. Zyrtec. 5. Roxicodone.   FAMILY HISTORY: Positive for coronary artery disease.   SOCIAL HISTORY:   He denies tobacco use but occasionally drinks heavily.   PHYSICAL EXAMINATION:  GENERAL: He is alert, oriented times three, in no acute distress.   VITAL SIGNS: Stable.   NECK: No JVD.   LUNGS: Clear.   HEART: Regular rate and rhythm. Normal S1, S2. No audible murmur.   ABDOMEN: Soft, nontender, positive bowel sounds.   EXTREMITIES: No pedal edema   LABORATORY AND DIAGNOSTIC DATA: EKG shows sinus tachycardia, 120 beats per minute. CPK was mildly elevated at 465 and then 1388. Troponin was 0.02 and then it was 0.19 and 0.09.   ASSESSMENT AND PLAN: The patient has syncope, which may be related to dehydration or use of illicit drugs EKG is unremarkable. Mildly elevated troponin is most likely due to rhabdomyolysis due to fall. Etiology of the CT is unclear. May  be new onset seizure disorder. May be related to medications that he takes, but I think he can go home with followup with me in the office next week. Thank you very much for the referral.    ____________________________ Laurier NancyShaukat A. Deonta Bomberger, MD sak:bjt D: 11/13/2011 08:06:32 ET T: 11/13/2011 08:32:51 ET JOB#: [id number]  cc: Laurier NancyShaukat A. Ermie Glendenning, MD, <Dictator> Laurier NancySHAUKAT A [place]il[place] Mcphee MD ELECTRONICALLY SIGNED 11/19/2011 9:04

## 2014-11-25 NOTE — Consult Note (Signed)
Allergies:  No Known Allergies:   Assessment/Plan:   Assessment/Plan 39 yo M admitted with syncope. Patient was found to have incidental low TSH, low free T4 and total T4 and high T3 levels. He was seen and examined. He admits to taking T3 50 mcg daily for the last several months. He plans to stop the T3. He also takes testosterone cypionate [phone] mg IM twice weekly.  A /  1. 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 Testosterone abuse  P/ Counseled patient to stop the T3. Have thyroid function tests respeated by PCP in 4 weeks. Discussed adverse effects of hyperthyroidism.  Counseled patient to taper down and off the testosterone. Have testosterone levels rechecked by PCP. Discussed adverse effects of excessive testosterone.  Full consult will be dictated.   Electronic Signatures: Raj JanusSolum, Anna M (MD)  (Signed 12-Apr-13 14:30)  Authored: ALLERGIES, Assessment/Plan   Last Updated: 12-Apr-13 14:30 by Raj JanusSolum, Anna M (MD)

## 2020-05-30 DIAGNOSIS — R0683 Snoring: Secondary | ICD-10-CM | POA: Diagnosis not present

## 2020-05-30 DIAGNOSIS — I10 Essential (primary) hypertension: Secondary | ICD-10-CM | POA: Diagnosis not present

## 2020-05-30 DIAGNOSIS — F4329 Adjustment disorder with other symptoms: Secondary | ICD-10-CM | POA: Diagnosis not present

## 2020-05-30 DIAGNOSIS — F5102 Adjustment insomnia: Secondary | ICD-10-CM | POA: Diagnosis not present

## 2020-06-18 DIAGNOSIS — K219 Gastro-esophageal reflux disease without esophagitis: Secondary | ICD-10-CM | POA: Diagnosis not present

## 2020-06-18 DIAGNOSIS — Z6832 Body mass index (BMI) 32.0-32.9, adult: Secondary | ICD-10-CM | POA: Diagnosis not present

## 2020-06-18 DIAGNOSIS — F5102 Adjustment insomnia: Secondary | ICD-10-CM | POA: Diagnosis not present

## 2020-06-18 DIAGNOSIS — I10 Essential (primary) hypertension: Secondary | ICD-10-CM | POA: Diagnosis not present

## 2020-07-03 DIAGNOSIS — F5102 Adjustment insomnia: Secondary | ICD-10-CM | POA: Diagnosis not present

## 2020-07-03 DIAGNOSIS — G4733 Obstructive sleep apnea (adult) (pediatric): Secondary | ICD-10-CM | POA: Diagnosis not present

## 2020-07-03 DIAGNOSIS — G2581 Restless legs syndrome: Secondary | ICD-10-CM | POA: Diagnosis not present

## 2020-07-18 DIAGNOSIS — Z6835 Body mass index (BMI) 35.0-35.9, adult: Secondary | ICD-10-CM | POA: Diagnosis not present

## 2020-07-18 DIAGNOSIS — I10 Essential (primary) hypertension: Secondary | ICD-10-CM | POA: Diagnosis not present

## 2020-07-18 DIAGNOSIS — F5102 Adjustment insomnia: Secondary | ICD-10-CM | POA: Diagnosis not present

## 2020-07-18 DIAGNOSIS — F4329 Adjustment disorder with other symptoms: Secondary | ICD-10-CM | POA: Diagnosis not present

## 2020-10-03 DIAGNOSIS — F909 Attention-deficit hyperactivity disorder, unspecified type: Secondary | ICD-10-CM | POA: Diagnosis not present

## 2020-10-03 DIAGNOSIS — F5102 Adjustment insomnia: Secondary | ICD-10-CM | POA: Diagnosis not present

## 2020-10-03 DIAGNOSIS — I10 Essential (primary) hypertension: Secondary | ICD-10-CM | POA: Diagnosis not present

## 2020-10-03 DIAGNOSIS — Z79899 Other long term (current) drug therapy: Secondary | ICD-10-CM | POA: Diagnosis not present

## 2020-10-03 DIAGNOSIS — K219 Gastro-esophageal reflux disease without esophagitis: Secondary | ICD-10-CM | POA: Diagnosis not present

## 2020-10-04 DIAGNOSIS — Z113 Encounter for screening for infections with a predominantly sexual mode of transmission: Secondary | ICD-10-CM | POA: Diagnosis not present

## 2020-10-04 DIAGNOSIS — D509 Iron deficiency anemia, unspecified: Secondary | ICD-10-CM | POA: Diagnosis not present

## 2020-10-04 DIAGNOSIS — D72829 Elevated white blood cell count, unspecified: Secondary | ICD-10-CM | POA: Diagnosis not present

## 2020-10-04 DIAGNOSIS — Z6837 Body mass index (BMI) 37.0-37.9, adult: Secondary | ICD-10-CM | POA: Diagnosis not present

## 2020-10-11 DIAGNOSIS — F909 Attention-deficit hyperactivity disorder, unspecified type: Secondary | ICD-10-CM | POA: Diagnosis not present

## 2020-10-11 DIAGNOSIS — F411 Generalized anxiety disorder: Secondary | ICD-10-CM | POA: Diagnosis not present

## 2020-10-11 DIAGNOSIS — R0902 Hypoxemia: Secondary | ICD-10-CM | POA: Diagnosis not present

## 2020-10-11 DIAGNOSIS — G4700 Insomnia, unspecified: Secondary | ICD-10-CM | POA: Diagnosis not present

## 2020-10-11 DIAGNOSIS — I499 Cardiac arrhythmia, unspecified: Secondary | ICD-10-CM | POA: Diagnosis not present

## 2020-10-11 DIAGNOSIS — R000 Tachycardia, unspecified: Secondary | ICD-10-CM | POA: Diagnosis not present

## 2020-10-11 DIAGNOSIS — D509 Iron deficiency anemia, unspecified: Secondary | ICD-10-CM | POA: Diagnosis not present

## 2020-10-11 DIAGNOSIS — I10 Essential (primary) hypertension: Secondary | ICD-10-CM | POA: Diagnosis not present

## 2020-10-11 DIAGNOSIS — I4891 Unspecified atrial fibrillation: Secondary | ICD-10-CM | POA: Diagnosis not present

## 2020-10-11 HISTORY — DX: Unspecified atrial fibrillation: I48.91

## 2020-10-12 DIAGNOSIS — F411 Generalized anxiety disorder: Secondary | ICD-10-CM | POA: Diagnosis not present

## 2020-10-12 DIAGNOSIS — I4891 Unspecified atrial fibrillation: Secondary | ICD-10-CM | POA: Diagnosis not present

## 2020-10-12 DIAGNOSIS — I10 Essential (primary) hypertension: Secondary | ICD-10-CM | POA: Insufficient documentation

## 2020-10-12 HISTORY — DX: Generalized anxiety disorder: F41.1

## 2020-10-12 HISTORY — DX: Essential (primary) hypertension: I10

## 2020-10-15 DIAGNOSIS — Z7689 Persons encountering health services in other specified circumstances: Secondary | ICD-10-CM | POA: Diagnosis not present

## 2020-10-15 DIAGNOSIS — Z1211 Encounter for screening for malignant neoplasm of colon: Secondary | ICD-10-CM | POA: Diagnosis not present

## 2020-10-15 DIAGNOSIS — I4891 Unspecified atrial fibrillation: Secondary | ICD-10-CM | POA: Diagnosis not present

## 2020-10-15 DIAGNOSIS — Z6837 Body mass index (BMI) 37.0-37.9, adult: Secondary | ICD-10-CM | POA: Diagnosis not present

## 2020-10-16 ENCOUNTER — Encounter: Payer: Self-pay | Admitting: Cardiology

## 2020-10-16 DIAGNOSIS — K219 Gastro-esophageal reflux disease without esophagitis: Secondary | ICD-10-CM | POA: Insufficient documentation

## 2020-10-16 DIAGNOSIS — G4700 Insomnia, unspecified: Secondary | ICD-10-CM | POA: Insufficient documentation

## 2020-10-16 DIAGNOSIS — F909 Attention-deficit hyperactivity disorder, unspecified type: Secondary | ICD-10-CM | POA: Insufficient documentation

## 2020-10-16 DIAGNOSIS — H5440 Blindness, one eye, unspecified eye: Secondary | ICD-10-CM | POA: Insufficient documentation

## 2020-10-16 DIAGNOSIS — I10 Essential (primary) hypertension: Secondary | ICD-10-CM | POA: Insufficient documentation

## 2020-10-23 DIAGNOSIS — I4891 Unspecified atrial fibrillation: Secondary | ICD-10-CM | POA: Diagnosis not present

## 2020-10-28 DIAGNOSIS — F4329 Adjustment disorder with other symptoms: Secondary | ICD-10-CM | POA: Diagnosis not present

## 2020-10-28 DIAGNOSIS — I10 Essential (primary) hypertension: Secondary | ICD-10-CM | POA: Diagnosis not present

## 2020-10-28 DIAGNOSIS — Z6837 Body mass index (BMI) 37.0-37.9, adult: Secondary | ICD-10-CM | POA: Diagnosis not present

## 2020-10-28 DIAGNOSIS — I4891 Unspecified atrial fibrillation: Secondary | ICD-10-CM | POA: Diagnosis not present

## 2020-11-04 ENCOUNTER — Ambulatory Visit: Payer: BC Managed Care – PPO | Admitting: Cardiology

## 2020-11-12 DIAGNOSIS — I10 Essential (primary) hypertension: Secondary | ICD-10-CM | POA: Insufficient documentation

## 2020-11-13 DIAGNOSIS — I4891 Unspecified atrial fibrillation: Secondary | ICD-10-CM | POA: Diagnosis not present

## 2020-11-14 ENCOUNTER — Other Ambulatory Visit: Payer: Self-pay

## 2020-11-14 ENCOUNTER — Encounter: Payer: Self-pay | Admitting: Cardiology

## 2020-11-14 ENCOUNTER — Ambulatory Visit (INDEPENDENT_AMBULATORY_CARE_PROVIDER_SITE_OTHER): Payer: BC Managed Care – PPO | Admitting: Cardiology

## 2020-11-14 VITALS — BP 122/86 | HR 68 | Ht 70.0 in | Wt 267.2 lb

## 2020-11-14 DIAGNOSIS — E669 Obesity, unspecified: Secondary | ICD-10-CM

## 2020-11-14 DIAGNOSIS — R0683 Snoring: Secondary | ICD-10-CM | POA: Insufficient documentation

## 2020-11-14 DIAGNOSIS — I5020 Unspecified systolic (congestive) heart failure: Secondary | ICD-10-CM

## 2020-11-14 DIAGNOSIS — I10 Essential (primary) hypertension: Secondary | ICD-10-CM

## 2020-11-14 DIAGNOSIS — R5383 Other fatigue: Secondary | ICD-10-CM | POA: Insufficient documentation

## 2020-11-14 DIAGNOSIS — R0989 Other specified symptoms and signs involving the circulatory and respiratory systems: Secondary | ICD-10-CM | POA: Diagnosis not present

## 2020-11-14 DIAGNOSIS — I480 Paroxysmal atrial fibrillation: Secondary | ICD-10-CM | POA: Insufficient documentation

## 2020-11-14 DIAGNOSIS — I340 Nonrheumatic mitral (valve) insufficiency: Secondary | ICD-10-CM

## 2020-11-14 HISTORY — DX: Snoring: R06.83

## 2020-11-14 HISTORY — DX: Paroxysmal atrial fibrillation: I48.0

## 2020-11-14 HISTORY — DX: Other fatigue: R53.83

## 2020-11-14 HISTORY — DX: Obesity, unspecified: E66.9

## 2020-11-14 HISTORY — DX: Other specified symptoms and signs involving the circulatory and respiratory systems: R09.89

## 2020-11-14 HISTORY — DX: Unspecified systolic (congestive) heart failure: I50.20

## 2020-11-14 MED ORDER — ENTRESTO 24-26 MG PO TABS: 1.0000 | ORAL_TABLET | Freq: Two times a day (BID) | ORAL | 3 refills | Status: DC

## 2020-11-14 MED ORDER — SPIRONOLACTONE 25 MG PO TABS: 12.5000 mg | ORAL_TABLET | Freq: Every day | ORAL | 3 refills | Status: DC

## 2020-11-14 NOTE — Patient Instructions (Addendum)
Medication Instructions:  Your physician has recommended you make the following change in your medication:  STOP: Lisinopril START: Aldactone 12.5 mg once daily  START: Entresto 24-26 mg twice daily *If you need a refill on your cardiac medications before your next appointment, please call your pharmacy*   Lab Work: Your physician recommends that you return for lab work: TODAY: BMET, Mag If you have labs (blood work) drawn today and your tests are completely normal, you will receive your results only by: [name] MyChart Message (if you have MyChart) OR . A paper copy in the mail If you have any lab test that is abnormal or we need to change your treatment, we will call you to review the results.   Testing/Procedures: Your physician has recommended that you have a sleep study. This test records several body functions during sleep, including: brain activity, eye movement, oxygen and carbon dioxide blood levels, heart rate and rhythm, breathing rate and rhythm, the flow of air through your mouth and nose, snoring, body muscle movements, and chest and belly movement.   Follow-Up: At [facility], you and your health needs are our priority.  As part of our continuing mission to provide you with exceptional heart care, we have created designated Provider Care Teams.  These Care Teams include your primary Cardiologist (physician) and Advanced Practice Providers (APPs -  Physician Assistants and Nurse Practitioners) who all work together to provide you with the care you need, when you need it.  We recommend signing up for the patient portal called "MyChart".  Sign up information is provided on this After Visit Summary.  MyChart is used to connect with patients for Virtual Visits (Telemedicine).  Patients are able to view lab/test results, encounter notes, upcoming appointments, etc.  Non-urgent messages can be sent to your provider as well.   To learn more about what you can do with MyChart, go to  [email or web address].    Your next appointment:   4 week(s)  The format for your next appointment:   In Person  Provider:   Thomasene Ripple, DO   Other Instructions  Stop Lisinopril wait 3 days then start Entresto on Monday.

## 2020-11-14 NOTE — Progress Notes (Signed)
Cardiology Office Note:    Date:  11/14/2020   ID:  Dylan Page, DOB Jun 30, 1976, MRN [id number]  PCP:  Buckner Malta, MD  Cardiologist:  Thomasene Ripple, DO  Electrophysiologist:  None   Referring MD: Buckner Malta, MD     History of Present Illness:    Dylan Page is a 45 y.o. male with a hx of atrial fibrillation on metoprolol succinate as well as Eliquis, hypertension, recently diagnosed depressed ejection fraction on echocardiogram done in March 2022 showed EF of 30 to 35%, presents at the request of his PCP to be seen for his newly diagnosed depressed ejection fraction.  The patient tells me on the day of his admission to [facility] hospital on October 11, 2020 he went to see his PCP he was found to be atrial fibrillation with rapid ventricular rate he was then asked to go to the emergency department.  While in the hospital the patient was started on rate control agents.  And subsequently had an echocardiogram which showed a EF of 30 to 35% with global hypokinesis, mild to moderate mitral regurgitation.   The patient tells me since his hospitalization he has been doing well.  And did recently see his PCP who recommended that he see cardiology.  He denies any chest pain, any lightheadedness, any dizziness.  He admits that he has been fatigued recently and also snores significantly.  He tells me that he also had had a zio  monitor placed by cardiology at [facility] but preferred to follow-up with Conner in the outpatient setting.   Past Medical History:  Diagnosis Date  . ADHD   . Blind left eye   . GERD (gastroesophageal reflux disease)   . Hypertension   . Insomnia     Past Surgical History:  Procedure Laterality Date  . ANKLE SURGERY Left 2003  . EYE SURGERY      Current Medications: Current Meds  Medication Sig  . alprazolam (XANAX) 2 MG tablet Take 2 mg by mouth at bedtime as needed for sleep.  [name] apixaban (ELIQUIS) 5 MG TABS tablet  Take 5 mg by mouth 2 (two) times daily.  [name] aspirin EC 81 MG tablet Take 81 mg by mouth daily. Swallow whole.  [name] atorvastatin (LIPITOR) 40 MG tablet Take 40 mg by mouth daily.  . cetirizine (ZYRTEC) 10 MG tablet Take 10 mg by mouth daily.  . metoprolol succinate (TOPROL-XL) 50 MG 24 hr tablet Take 150 mg by mouth daily. Take 3 tablets daily (150mg ) Take with or immediately following a meal.  . sacubitril-valsartan (ENTRESTO) 24-26 MG Take 1 tablet by mouth 2 (two) times daily.  spironolactone (ALDACTONE) 25 MG tablet Take 0.5 tablets (12.5 mg total) by mouth daily.  . traZODone (DESYREL) 100 MG tablet Take 50 mg by mouth at bedtime. Take 1/2 tablet (50mg ) nightly  . [DISCONTINUED] lisinopril (ZESTRIL) 20 MG tablet Take 20 mg by mouth daily. Takes 2 tablets once a day  . [DISCONTINUED] omeprazole (PRILOSEC) 40 MG capsule Take 40 mg by mouth daily.  . [DISCONTINUED] sertraline (ZOLOFT) 50 MG tablet Take 50 mg by mouth daily.     Allergies:   Patient has no known allergies.   Social History   Socioeconomic History  . Marital status: Unknown    Spouse name: Not on file  . Number of children: Not on file  . Years of education: Not on file  . Highest education level: Not on file  Occupational  History  . Not on file  Tobacco Use  . Smoking status: Never Smoker  . Smokeless tobacco: Never Used  Vaping Use  . Vaping Use: Never used  Substance and Sexual Activity  . Alcohol use: Yes    Comment: Occasional  . Drug use: Never  . Sexual activity: Not on file  Other Topics Concern  . Not on file  Social History Narrative  . Not on file   Social Determinants of Health   Financial Resource Strain: Not on file  Food Insecurity: Not on file  Transportation Needs: Not on file  Physical Activity: Not on file  Stress: Not on file  Social Connections: Not on file     Family History: The patient's family history includes Heart attack in his father; Heart disease in his father.  ROS:    Review of Systems  Constitution: Negative for decreased appetite, fever and weight gain.  HENT: Negative for congestion, ear discharge, hoarse voice and sore throat.   Eyes: Negative for discharge, redness, vision loss in right eye and visual halos.  Cardiovascular: Negative for chest pain, dyspnea on exertion, leg swelling, orthopnea and palpitations.  Respiratory: Negative for cough, hemoptysis, shortness of breath and snoring.   Endocrine: Negative for heat intolerance and polyphagia.  Hematologic/Lymphatic: Negative for bleeding problem. Does not bruise/bleed easily.  Skin: Negative for flushing, nail changes, rash and suspicious lesions.  Musculoskeletal: Negative for arthritis, joint pain, muscle cramps, myalgias, neck pain and stiffness.  Gastrointestinal: Negative for abdominal pain, bowel incontinence, diarrhea and excessive appetite.  Genitourinary: Negative for decreased libido, genital sores and incomplete emptying.  Neurological: Negative for brief paralysis, focal weakness, headaches and loss of balance.  Psychiatric/Behavioral: Negative for altered mental status, depression and suicidal ideas.  Allergic/Immunologic: Negative for HIV exposure and persistent infections.    EKGs/Labs/Other Studies Reviewed:    The following studies were reviewed today:   EKG:  The ekg ordered today demonstrates sinus rhythm, heart rate 68 bpm with T wave abnormalities cannot rule out lateral wall ischemia.  Compared to EKG from the record shown from his PCP office 01 October 2009 patient with atrial fibrillation rapid ventricular rate heart rate 171 bpm.  Transthoracic echocardiogram done in March 2022 There is severe concentric left ventricular hypertrophy with moderate  global hypokinesis and severe hypokinesis of the basal inferoseptal  and basal inferior segments. Considerable improvement in EF in post  PVC beats [post-extrasystolic potentiation], and beat to beat  variation in EF,  suggests improvement in systolic function with rate  control and sinus rhythm restoration. Present EF around 30-35%.  Left ventricular filling pattern and LAP are indeterminate.  The right ventricle is normal size and systolic function.  Mildly dilated atria.  There is mild to moderate mitral regurgitation.  There is mild tricuspid regurgitation.  No pulmonary hypertension.  IVC size was normal.   There is no comparison study available.  -  FINDINGS:  LEFT VENTRICLE  The left ventricular size is normal. There is severe concentric left  ventricular hypertrophy. LV ejection fraction = 30-35%. Left  ventricular systolic function is moderately reduced. Left ventricular  filling pattern is indeterminate. There is moderate global hypokinesis  of the left ventricle.   -  RIGHT VENTRICLE  The right ventricle is normal size. The right ventricular systolic  function is normal.   LEFT ATRIUM  The left atrium is mildly dilated.   RIGHT ATRIUM  The right atrium is mildly dilated.  -  AORTIC VALVE  Structurally normal  aortic valve. There is no aortic stenosis. There  is trace aortic regurgitation.  -  MITRAL VALVE  The mitral valve leaflets appear normal. There is mild to moderate  mitral regurgitation.  -  TRICUSPID VALVE  Structurally normal tricuspid valve. There is mild tricuspid  regurgitation. No pulmonary hypertension.  -  PULMONIC VALVE  The pulmonic valve is not well visualized.  -  ARTERIES  The aortic sinus is normal size.  -  VENOUS  IVC size was normal.  -  EFFUSION  There is no pericardial effusion.  -  -    Recent Labs: No results found for requested labs within last 8760 hours.  Recent Lipid Panel No results found for: CHOL, TRIG, HDL, CHOLHDL, VLDL, LDLCALC, LDLDIRECT  Physical Exam:    VS:  BP 122/86 (BP Location: Right Arm)   Pulse 68   Ht 5\' 10"  (1.778 m)   Wt 267 lb 3.2 oz (121.2 kg)   SpO2 97%   BMI 38.34 kg/m     Wt Readings from Last  3 Encounters:  11/14/20 267 lb 3.2 oz (121.2 kg)  10/11/20 267 lb (121.1 kg)     GEN: Well nourished, well developed in no acute distress HEENT: Normal NECK: No JVD; No carotid bruits LYMPHATICS: No lymphadenopathy CARDIAC: S1S2 noted,RRR, no murmurs, rubs, gallops RESPIRATORY:  Clear to auscultation without rales, wheezing or rhonchi  ABDOMEN: Soft, non-tender, non-distended, +bowel sounds, no guarding. EXTREMITIES: No edema, No cyanosis, no clubbing MUSCULOSKELETAL:  No deformity  SKIN: Warm and dry NEUROLOGIC:  Alert and oriented x 3, non-focal PSYCHIATRIC:  Normal affect, good insight  ASSESSMENT:    1. PAF (paroxysmal atrial fibrillation) (HCC)   2. HFrEF (heart failure with reduced ejection fraction) (HCC)   3. Depressed left ventricular ejection fraction   4. Snoring   5. Fatigue, unspecified type    PLAN:     He is in sinus rhythm today.  He did wear a monitor he tells me so we can get information on that to see his A. fib burden as his hospitalization.  He currently is on metoprolol succinate 150 mg daily which was keeping his rate control for now we will keep him on this.  There are options for rhythm control strategy in this young patient which will include antiarrhythmics as well as pulmonary vein isolation.  ChadsVAsc score is 2 continue patient's Eliquis 5 mg twice a day for now.   First I think we should get him worked up for an ischemic evaluation given his severely depressed ejection fraction.-Given his low probability coronary artery disease I am suspecting that his arrhythmia may have led to his depressed ejection fraction.  In any rate we will going to refer the patient to our heart failure team and will defer to them for the ischemic evaluation.  Reviewed his medication with him.  He is currently on lisinopril which we are going to stop.  I have educated the patient about the washout period for the lisinopril.  Shared decision the patient will stop his lisinopril  for 3 days.  Once he stopped his lisinopril he is, start Aldactone 12.5 mg daily.  And then after 3 days of stopping his lisinopril and starting Aldactone he will start Entresto 24-26 mg twice daily.  We talked about early sleep apnea given his snoring fatigue as well as making sure that sleep apnea is not playing a role in his atrial fibrillation and hypertension.  In addition his STOP-BANG score is 4 which  projects high risk for OSA.  He is agreeable for sleep study we will going order this today.  The patient understands the need to lose weight with diet and exercise. We have discussed specific strategies for this.  Blood work will be done today to assess creatinine as well as electrolytes.  The patient is in agreement with the above plan. The patient left the office in stable condition.  The patient will follow up in 4 weeks due to medication change   Medication Adjustments/Labs and Tests Ordered: Current medicines are reviewed at length with the patient today.  Concerns regarding medicines are outlined above.  Orders Placed This Encounter  Procedures  . Basic metabolic panel  . Magnesium  . AMB referral to CHF clinic  . EKG 12-Lead  . Split night study   Meds ordered this encounter  Medications  . sacubitril-valsartan (ENTRESTO) 24-26 MG    Sig: Take 1 tablet by mouth 2 (two) times daily.    Dispense:  180 tablet    Refill:  3  . spironolactone (ALDACTONE) 25 MG tablet    Sig: Take 0.5 tablets (12.5 mg total) by mouth daily.    Dispense:  45 tablet    Refill:  3    Patient Instructions  Medication Instructions:  Your physician has recommended you make the following change in your medication:  STOP: Lisinopril START: Aldactone 12.5 mg once daily  START: Entresto 24-26 mg twice daily *If you need a refill on your cardiac medications before your next appointment, please call your pharmacy*   Lab Work: Your physician recommends that you return for lab work: TODAY: BMET,  Mag If you have labs (blood work) drawn today and your tests are completely normal, you will receive your results only by: [name]. MyChart Message (if you have MyChart) OR . A paper copy in the mail If you have any lab test that is abnormal or we need to change your treatment, we will call you to review the results.   Testing/Procedures: Your physician has recommended that you have a sleep study. This test records several body functions during sleep, including: brain activity, eye movement, oxygen and carbon dioxide blood levels, heart rate and rhythm, breathing rate and rhythm, the flow of air through your mouth and nose, snoring, body muscle movements, and chest and belly movement.   Follow-Up: At [facility], you and your health needs are our priority.  As part of our continuing mission to provide you with exceptional heart care, we have created designated Provider Care Teams.  These Care Teams include your primary Cardiologist (physician) and Advanced Practice Providers (APPs -  Physician Assistants and Nurse Practitioners) who all work together to provide you with the care you need, when you need it.  We recommend signing up for the patient portal called "MyChart".  Sign up information is provided on this After Visit Summary.  MyChart is used to connect with patients for Virtual Visits (Telemedicine).  Patients are able to view lab/test results, encounter notes, upcoming appointments, etc.  Non-urgent messages can be sent to your provider as well.   To learn more about what you can do with MyChart, go to [email or web address].    Your next appointment:   4 week(s)  The format for your next appointment:   In Person  Provider:   Thomasene RippleKardie Jaila Schellhorn, DO   Other Instructions  Stop Lisinopril wait 3 days then start Entresto on Monday.     Adopting a Healthy Lifestyle.  Know what a  healthy weight is for you (roughly BMI <25) and aim to maintain this   Aim for 7+ servings of fruits and  vegetables daily   65-80+ fluid ounces of water or unsweet tea for healthy kidneys   Limit to max 1 drink of alcohol per day; avoid smoking/tobacco   Limit animal fats in diet for cholesterol and heart health - choose grass fed whenever available   Avoid highly processed foods, and foods high in saturated/trans fats   Aim for low stress - take time to unwind and care for your mental health   Aim for 150 min of moderate intensity exercise weekly for heart health, and weights twice weekly for bone health   Aim for 7-9 hours of sleep daily   When it comes to diets, agreement about the perfect plan isnt easy to find, even among the experts. Experts at the [facility] [employer] developed an idea known as the Healthy Eating Plate. Just imagine a plate divided into logical, healthy portions.   The emphasis is on diet quality:   Load up on vegetables and fruits - one-half of your plate: Aim for color and variety, and remember that potatoes dont count.   Go for whole grains - one-quarter of your plate: Whole wheat, barley, wheat berries, quinoa, oats, brown rice, and foods made with them. If you want pasta, go with whole wheat pasta.   Protein power - one-quarter of your plate: Fish, chicken, beans, and nuts are all healthy, versatile protein sources. Limit red meat.   The diet, however, does go beyond the plate, offering a few other suggestions.   Use healthy plant oils, such as olive, canola, soy, corn, sunflower and peanut. Check the labels, and avoid partially hydrogenated oil, which have unhealthy trans fats.   If youre thirsty, drink water. Coffee and tea are good in moderation, but skip sugary drinks and limit milk and dairy products to one or two daily servings.   The type of carbohydrate in the diet is more important than the amount. Some sources of carbohydrates, such as vegetables, fruits, whole grains, and beans-are healthier than others.   Finally, stay  active  Signed, Thomasene Ripple, DO  11/14/2020 11:03 AM    [facility]

## 2020-11-15 LAB — BASIC METABOLIC PANEL
BUN/Creatinine Ratio: 8 — ABNORMAL LOW (ref 9–20)
BUN: 6 mg/dL (ref 6–24)
CO2: 28 mmol/L (ref 20–29)
Calcium: 9.3 mg/dL (ref 8.7–10.2)
Chloride: 95 mmol/L — ABNORMAL LOW (ref 96–106)
Creatinine, Ser: 0.8 mg/dL (ref 0.76–1.27)
Glucose: 107 mg/dL — ABNORMAL HIGH (ref 65–99)
Potassium: 4 mmol/L (ref 3.5–5.2)
Sodium: 140 mmol/L (ref 134–144)
eGFR: 113 mL/min/{1.73_m2} (ref >59)

## 2020-11-15 LAB — MAGNESIUM: Magnesium: 1.8 mg/dL (ref 1.6–2.3)

## 2020-11-18 ENCOUNTER — Telehealth: Payer: Self-pay | Admitting: Cardiology

## 2020-11-18 NOTE — Telephone Encounter (Signed)
Left message for patient to return our call.

## 2020-11-18 NOTE — Telephone Encounter (Signed)
New message:    Patient calling stating that they are doing everything that has been asked of them. I read the message to the patient. Please call if need to.

## 2020-11-18 NOTE — Telephone Encounter (Signed)
No I do not know what this is about. I was calling him with results earlier but he did not answer.

## 2020-11-19 ENCOUNTER — Telehealth: Payer: Self-pay | Admitting: *Deleted

## 2020-11-19 ENCOUNTER — Telehealth: Payer: Self-pay | Admitting: Cardiology

## 2020-11-19 NOTE — Telephone Encounter (Signed)
Pt c/o medication issue:  1. Name of Medication: sacubitril-valsartan (ENTRESTO) 24-26 MG  2. How are you currently taking this medication (dosage and times per day)? As prescribed   3. Are you having a reaction (difficulty breathing--STAT)? No   4. What is your medication issue?   Patient's daughter states when they went to pick up this medication it was $600. She states they are unable to afford it and they would like to know if the patient is eligible for an alternative or if we are able to provide assistance. She states with Eliquis he was given a card at the hospital that made his Eliquis only $10/month. She states she is hoping to find similar assistance with the [facility] and is currently looking for a coupon online.

## 2020-11-19 NOTE — Telephone Encounter (Signed)
Staff message sent to Dylan Page to call patient and notify of June 16 th sleep study appointment.

## 2020-11-19 NOTE — Telephone Encounter (Signed)
Spoke with [name] and provided her with the information for the [facility] Co-pay card. She had questions regarding the pts medications and I went over each medication with her and answered her questions. [name] verbalized understanding and had no additional questions. [name] will callback for question or issues with co-pay card.

## 2020-11-20 NOTE — Telephone Encounter (Signed)
Spoke with patient. See chart.  

## 2020-12-05 DIAGNOSIS — I4891 Unspecified atrial fibrillation: Secondary | ICD-10-CM | POA: Diagnosis not present

## 2020-12-05 DIAGNOSIS — Z6841 Body Mass Index (BMI) 40.0 and over, adult: Secondary | ICD-10-CM | POA: Diagnosis not present

## 2020-12-05 DIAGNOSIS — F4329 Adjustment disorder with other symptoms: Secondary | ICD-10-CM | POA: Diagnosis not present

## 2020-12-05 DIAGNOSIS — I5020 Unspecified systolic (congestive) heart failure: Secondary | ICD-10-CM | POA: Diagnosis not present

## 2020-12-10 ENCOUNTER — Telehealth: Payer: Self-pay | Admitting: Cardiology

## 2020-12-10 MED ORDER — FUROSEMIDE 40 MG PO TABS: 40.0000 mg | ORAL_TABLET | Freq: Every day | ORAL | 1 refills | Status: DC

## 2020-12-10 NOTE — Telephone Encounter (Signed)
Please start lasix 40 mg daily

## 2020-12-10 NOTE — Telephone Encounter (Signed)
Advised of Dr. Mallory Shirk recommendations. Lasix sent to pharmacy.

## 2020-12-10 NOTE — Addendum Note (Signed)
Addended by: Eleonore Chiquito on: 12/10/2020 03:00 PM   Modules accepted: Orders

## 2020-12-10 NOTE — Telephone Encounter (Signed)
Called and spoke with pt and his wife who states pt has gained to 293 pounds since last visit where he weighted 267. Denies swelling in legs. C/O increased shortness of breath. Wife states that they have been trying to eat better as well. Pt's BP has been all over the place as well but they state that there machine may not be accurate due to the cuff sive vs arm size.  4/29 151/96 HR 64 5/1 132/72 HR 72 5/7 164/97 HR 64 5/9 152/93 HR 64 5/10 165/113 HR 61 These readings were not done at any specific times or after medications. Pt has an appt with you on Thursday 5/12. How do you advise?

## 2020-12-10 NOTE — Telephone Encounter (Signed)
Pt c/o Shortness Of Breath: STAT if SOB developed within the last 24 hours or pt is noticeably SOB on the phone  1. Are you currently SOB (can you hear that pt is SOB on the phone)?  Not currently, per patient's wife  2. How long have you been experiencing SOB?  About 2 months on and off, but patient's wife states it became noticeable to her over the past week  3. Are you SOB when sitting or when up moving around?  When up and moving around  4. Are you currently experiencing any other symptoms? Patient's wife states the patient also gained about 25 lbs in 1 month, w/o any swelling. She reports yesterday he weighed 293 lbs and last month he weighed 264 lbs. She is concerned his recent medication changes may have caused the weight gain.

## 2020-12-12 ENCOUNTER — Ambulatory Visit (INDEPENDENT_AMBULATORY_CARE_PROVIDER_SITE_OTHER): Payer: BC Managed Care – PPO | Admitting: Cardiology

## 2020-12-12 ENCOUNTER — Other Ambulatory Visit: Payer: Self-pay

## 2020-12-12 ENCOUNTER — Encounter: Payer: Self-pay | Admitting: Cardiology

## 2020-12-12 VITALS — BP 122/80 | HR 70 | Ht 71.0 in | Wt 278.4 lb

## 2020-12-12 DIAGNOSIS — I10 Essential (primary) hypertension: Secondary | ICD-10-CM | POA: Diagnosis not present

## 2020-12-12 DIAGNOSIS — R0989 Other specified symptoms and signs involving the circulatory and respiratory systems: Secondary | ICD-10-CM | POA: Diagnosis not present

## 2020-12-12 DIAGNOSIS — I420 Dilated cardiomyopathy: Secondary | ICD-10-CM | POA: Diagnosis not present

## 2020-12-12 DIAGNOSIS — I480 Paroxysmal atrial fibrillation: Secondary | ICD-10-CM | POA: Diagnosis not present

## 2020-12-12 MED ORDER — DAPAGLIFLOZIN PROPANEDIOL 10 MG PO TABS: 10.0000 mg | ORAL_TABLET | Freq: Every day | ORAL | 3 refills | Status: AC

## 2020-12-12 MED ORDER — ATORVASTATIN CALCIUM 40 MG PO TABS: 40.0000 mg | ORAL_TABLET | Freq: Every day | ORAL | 3 refills | Status: DC

## 2020-12-12 NOTE — Patient Instructions (Signed)
Medication Instructions:  Your physician has recommended you make the following change in your medication: START: Farxiga 10 mg daily *If you need a refill on your cardiac medications before your next appointment, please call your pharmacy*   Lab Work: Your physician recommends that you return for lab work: TODAY: BMET, Mag, CBC If you have labs (blood work) drawn today and your tests are completely normal, you will receive your results only by: [name] MyChart Message (if you have MyChart) OR . A paper copy in the mail If you have any lab test that is abnormal or we need to change your treatment, we will call you to review the results.   Testing/Procedures:    [facility] AT [place] [address] [place] [address] Dept: [phone] Loc: [phone]  Dylan Page  12/12/2020  You are scheduled for a Cardiac Catheterization on Thursday, May 26 with Dr. Peter [place].  1. Please arrive at the [facility] (Main Entrance A) at [facility]: [address] [place], [place] [address] at 5:30 AM (This time is two hours before your procedure to ensure your preparation). Free valet parking service is available.   Special note: Every effort is made to have your procedure done on time. Please understand that emergencies sometimes delay scheduled procedures.  2. Diet: Do not eat solid foods after midnight.  The patient may have clear liquids until 5am upon the day of the procedure.  3. Labs: You will need to have blood drawn on TODAY.  4. Medication instructions in preparation for your procedure:   Contrast Allergy: No  Stop taking Eliquis (Apixiban) on Tuesday, May 24.   On the morning of your procedure, take your Aspirin and any morning medicines NOT listed above.  You may use sips of water.  5. Plan for one night stay--bring personal belongings. 6. Bring a current list of your medications and current  insurance cards. 7. You MUST have a responsible person to drive you home. 8. Someone MUST be with you the first 24 hours after you arrive home or your discharge will be delayed. 9. Please wear clothes that are easy to get on and off and wear slip-on shoes.  Thank you for allowing [place] to care for you!   -- [facility]    Follow-Up: At [facility], you and your health needs are our priority.  As part of our continuing mission to provide you with exceptional heart care, we have created designated Provider Care Teams.  These Care Teams include your primary Cardiologist (physician) and Advanced Practice Providers (APPs -  Physician Assistants and Nurse Practitioners) who all work together to provide you with the care you need, when you need it.  We recommend signing up for the patient portal called "MyChart".  Sign up information is provided on this After Visit Summary.  MyChart is used to connect with patients for Virtual Visits (Telemedicine).  Patients are able to view lab/test results, encounter notes, upcoming appointments, etc.  Non-urgent messages can be sent to your provider as well.   To learn more about what you can do with MyChart, go to [email or web address].    Your next appointment:   4 week(s)  The format for your next appointment:   In Person  Provider:   Thomasene Ripple, DO   Other Instructions

## 2020-12-12 NOTE — Progress Notes (Signed)
Cardiology Office Note:    Date:  12/12/2020   ID:  Dylan Page, DOB 1975-09-01, MRN [id number]  PCP:  Buckner Malta, MD  Cardiologist:  Thomasene Ripple, DO  Electrophysiologist:  None   Referring MD: Buckner Malta, MD     History of Present Illness:    Dylan Page is a 45 y.o. male with a hx of atrial fibrillation on metoprolol succinate as well as Eliquis, hypertension, recently diagnosed depressed ejection fraction on echocardiogram done in March 2022 showed EF of 30 to 35%,.  At his initial visit he reported that he post admission to [facility] on October 11, 2020 he went to see his PCP he was found to be atrial fibrillation with rapid ventricular rate he was then asked to go to the emergency department.  While in the hospital the patient was started on rate control agents.  And subsequently had an echocardiogram which showed a EF of 30 to 35% with global hypokinesis, mild to moderate mitral regurgitation.  At the conclusion of that visit the patient reported that he had worn a ZIO monitor.  He was in sinus rhythm that day I continued the patient on his Eliquis as well as his metoprolol succinate 150 mg daily.  For his dilated cardiomyopathy I stopped his lisinopril and transition the patient to [facility].  I also started patient on Aldactone 12.5 mg daily.  I recommended the patient get a sleep study and a sleep study was ordered.  I referred the patient to our advanced heart failure team but he has not heard from them yet.  He is here today for follow-up visit.  He tells me that his symptoms are improving.  He still does have some shortness of breath.  Recently the patient wife called that he was experiencing significant leg edema and I started him on Lasix 20 mg daily and they both tell me that helped tremendously.  He is here today for the visit with his wife Dylan Page.  Past Medical History:  Diagnosis Date  . ADHD   . Blind left eye   . GERD  (gastroesophageal reflux disease)   . Hypertension   . Insomnia     Past Surgical History:  Procedure Laterality Date  . ANKLE SURGERY Left 2003  . EYE SURGERY      Current Medications: Current Meds  Medication Sig  . alprazolam (XANAX) 2 MG tablet Take 2 mg by mouth at bedtime as needed for sleep.  [name] apixaban (ELIQUIS) 5 MG TABS tablet Take 5 mg by mouth 2 (two) times daily.  . cetirizine (ZYRTEC) 10 MG tablet Take 10 mg by mouth daily.  . dapagliflozin propanediol (FARXIGA) 10 MG TABS tablet Take 1 tablet (10 mg total) by mouth daily before breakfast.  . furosemide (LASIX) 40 MG tablet Take 1 tablet (40 mg total) by mouth daily.  . metoprolol succinate (TOPROL-XL) 50 MG 24 hr tablet Take 150 mg by mouth daily. Take 3 tablets daily (150mg ) Take with or immediately following a meal.  . naltrexone (DEPADE) 50 MG tablet Take 50 mg by mouth daily.  . sacubitril-valsartan (ENTRESTO) 24-26 MG Take 1 tablet by mouth 2 (two) times daily.  spironolactone (ALDACTONE) 25 MG tablet Take 0.5 tablets (12.5 mg total) by mouth daily.  . traZODone (DESYREL) 100 MG tablet Take 50 mg by mouth at bedtime. Take 1/2 tablet (50mg ) nightly     Allergies:   Patient has no known allergies.   Social History  Socioeconomic History  . Marital status: Unknown    Spouse name: Not on file  . Number of children: Not on file  . Years of education: Not on file  . Highest education level: Not on file  Occupational History  . Not on file  Tobacco Use  . Smoking status: Never Smoker  . Smokeless tobacco: Never Used  Vaping Use  . Vaping Use: Never used  Substance and Sexual Activity  . Alcohol use: Yes    Comment: Occasional  . Drug use: Never  . Sexual activity: Not on file  Other Topics Concern  . Not on file  Social History Narrative  . Not on file   Social Determinants of Health   Financial Resource Strain: Not on file  Food Insecurity: Not on file  Transportation Needs: Not on file   Physical Activity: Not on file  Stress: Not on file  Social Connections: Not on file     Family History: The patient's family history includes Heart attack in his father; Heart disease in his father.  ROS:   Review of Systems  Constitution: Negative for decreased appetite, fever and weight gain.  HENT: Negative for congestion, ear discharge, hoarse voice and sore throat.   Eyes: Negative for discharge, redness, vision loss in right eye and visual halos.  Cardiovascular: Negative for chest pain, dyspnea on exertion, leg swelling, orthopnea and palpitations.  Respiratory: Negative for cough, hemoptysis, shortness of breath and snoring.   Endocrine: Negative for heat intolerance and polyphagia.  Hematologic/Lymphatic: Negative for bleeding problem. Does not bruise/bleed easily.  Skin: Negative for flushing, nail changes, rash and suspicious lesions.  Musculoskeletal: Negative for arthritis, joint pain, muscle cramps, myalgias, neck pain and stiffness.  Gastrointestinal: Negative for abdominal pain, bowel incontinence, diarrhea and excessive appetite.  Genitourinary: Negative for decreased libido, genital sores and incomplete emptying.  Neurological: Negative for brief paralysis, focal weakness, headaches and loss of balance.  Psychiatric/Behavioral: Negative for altered mental status, depression and suicidal ideas.  Allergic/Immunologic: Negative for HIV exposure and persistent infections.    EKGs/Labs/Other Studies Reviewed:    The following studies were reviewed today:   EKG: None today  Transthoracic echocardiogram done in March 2022 There is severe concentric left ventricular hypertrophy with moderate  global hypokinesis and severe hypokinesis of the basal inferoseptal  and basal inferior segments. Considerable improvement in EF in post  PVC beats [post-extrasystolic potentiation], and beat to beat  variation in EF, suggests improvement in systolic function with rate   control and sinus rhythm restoration. Present EF around 30-35%.  Left ventricular filling pattern and LAP are indeterminate.  The right ventricle is normal size and systolic function.  Mildly dilated atria.  There is mild to moderate mitral regurgitation.  There is mild tricuspid regurgitation.  No pulmonary hypertension.  IVC size was normal.   There is no comparison study available.  -  FINDINGS:  LEFT VENTRICLE  The left ventricular size is normal. There is severe concentric left  ventricular hypertrophy. LV ejection fraction = 30-35%. Left  ventricular systolic function is moderately reduced. Left ventricular  filling pattern is indeterminate. There is moderate global hypokinesis  of the left ventricle.   -  RIGHT VENTRICLE  The right ventricle is normal size. The right ventricular systolic  function is normal.   LEFT ATRIUM  The left atrium is mildly dilated.   RIGHT ATRIUM  The right atrium is mildly dilated.  -  AORTIC VALVE  Structurally normal aortic valve. There is no  aortic stenosis. There  is trace aortic regurgitation.  -  MITRAL VALVE  The mitral valve leaflets appear normal. There is mild to moderate  mitral regurgitation.  -  TRICUSPID VALVE  Structurally normal tricuspid valve. There is mild tricuspid  regurgitation. No pulmonary hypertension.  -  PULMONIC VALVE  The pulmonic valve is not well visualized.  -  ARTERIES  The aortic sinus is normal size.  -  VENOUS  IVC size was normal.  -  EFFUSION  There is no pericardial effusion.   Recent Labs: 11/14/2020: BUN 6; Creatinine, Ser 0.80; Magnesium 1.8; Potassium 4.0; Sodium 140  Recent Lipid Panel No results found for: CHOL, TRIG, HDL, CHOLHDL, VLDL, LDLCALC, LDLDIRECT  Physical Exam:    VS:  BP 122/80   Pulse 70   Ht 5\' 11"  (1.803 m)   Wt 278 lb 6.4 oz (126.3 kg)   SpO2 98%   BMI 38.83 kg/m     Wt Readings from Last 3 Encounters:  12/12/20 278 lb 6.4 oz (126.3 kg)  11/14/20 267  lb 3.2 oz (121.2 kg)  10/11/20 267 lb (121.1 kg)     GEN: Well nourished, well developed in no acute distress HEENT: Normal NECK: No JVD; No carotid bruits LYMPHATICS: No lymphadenopathy CARDIAC: S1S2 noted,RRR, no murmurs, rubs, gallops RESPIRATORY:  Clear to auscultation without rales, wheezing or rhonchi  ABDOMEN: Soft, non-tender, non-distended, +bowel sounds, no guarding. EXTREMITIES: No edema, No cyanosis, no clubbing MUSCULOSKELETAL:  No deformity  SKIN: Warm and dry NEUROLOGIC:  Alert and oriented x 3, non-focal PSYCHIATRIC:  Normal affect, good insight  ASSESSMENT:    1. Dilated cardiomyopathy (HCC)   2. PAF (paroxysmal atrial fibrillation) (HCC)   3. Hypertension, unspecified type   4. Depressed left ventricular ejection fraction    PLAN:     He still does have some shortness of breath.  He has severely depressed ejection fraction which I started the patient on guideline directed medical therapy he is on Entresto 24-26 mg twice a day, he is on Aldactone 12.5 mg daily.  I am going to add Farxiga 10 mg today.  I do suspect that his cardiomyopathy may be tachycardia induced.  But what I would do given his shortness of breath as well and his risk factors have the patient get a left heart catheterization to rule out coronary artery disease.    If his heart cath is normal rhythm control will be our strategy and continue the patient on guideline directed medical therapy with a repeat echocardiogram.  His sleep study is planned for January 16, 2021.  We will get blood work today to assess his kidney function.  The patient his wife had a few questions about his current diagnosis to medications as well I was able to explain everything to them in the office visit.    His monitor which was placed by his PCP which he wore for 6 days no evidence of atrial fibrillation he was in sinus rhythm with his maximal heart rate going to 112 bpm however patient on significant high dose of  beta-blocker metoprolol succinate 150 mg daily.  No pauses he was in sinus rhythm the whole time.  The patient understands the need to lose weight with diet and exercise. We have discussed specific strategies for this.  The patient is in agreement with the above plan. The patient left the office in stable condition.  The patient will follow up in   Medication Adjustments/Labs and Tests Ordered: Current medicines are  reviewed at length with the patient today.  Concerns regarding medicines are outlined above.  Orders Placed This Encounter  Procedures  . Basic metabolic panel  . Magnesium  . CBC with Differential/Platelet   Meds ordered this encounter  Medications  . dapagliflozin propanediol (FARXIGA) 10 MG TABS tablet    Sig: Take 1 tablet (10 mg total) by mouth daily before breakfast.    Dispense:  90 tablet    Refill:  3  . atorvastatin (LIPITOR) 40 MG tablet    Sig: Take 1 tablet (40 mg total) by mouth daily.    Dispense:  90 tablet    Refill:  3    Patient Instructions  Medication Instructions:  Your physician has recommended you make the following change in your medication: START: Farxiga 10 mg daily *If you need a refill on your cardiac medications before your next appointment, please call your pharmacy*   Lab Work: Your physician recommends that you return for lab work: TODAY: BMET, Mag, CBC If you have labs (blood work) drawn today and your tests are completely normal, you will receive your results only by: [name] MyChart Message (if you have MyChart) OR . A paper copy in the mail If you have any lab test that is abnormal or we need to change your treatment, we will call you to review the results.   Testing/Procedures:    [facility] CARDIOVASCULAR DIVISION [facility] AT [place] [address] [place] [address] Dept: [phone] Loc: [phone]  Ayo Smoak  12/12/2020  You are scheduled for a Cardiac Catheterization  on Thursday, May 26 with Dr. Peter [place].  1. Please arrive at the [facility] (Main Entrance A) at [facility]: [address] [place], [place] [address] at 5:30 AM (This time is two hours before your procedure to ensure your preparation). Free valet parking service is available.   Special note: Every effort is made to have your procedure done on time. Please understand that emergencies sometimes delay scheduled procedures.  2. Diet: Do not eat solid foods after midnight.  The patient may have clear liquids until 5am upon the day of the procedure.  3. Labs: You will need to have blood drawn on TODAY.  4. Medication instructions in preparation for your procedure:   Contrast Allergy: No  Stop taking Eliquis (Apixiban) on Tuesday, May 24.   On the morning of your procedure, take your Aspirin and any morning medicines NOT listed above.  You may use sips of water.  5. Plan for one night stay--bring personal belongings. 6. Bring a current list of your medications and current insurance cards. 7. You MUST have a responsible person to drive you home. 8. Someone MUST be with you the first 24 hours after you arrive home or your discharge will be delayed. 9. Please wear clothes that are easy to get on and off and wear slip-on shoes.  Thank you for allowing [place] to care for you!   -- [facility]    Follow-Up: At [facility], you and your health needs are our priority.  As part of our continuing mission to provide you with exceptional heart care, we have created designated Provider Care Teams.  These Care Teams include your primary Cardiologist (physician) and Advanced Practice Providers (APPs -  Physician Assistants and Nurse Practitioners) who all work together to provide you with the care you need, when you need it.  We recommend signing up for the  patient portal called "MyChart".  Sign up information is provided on this After Visit Summary.  MyChart  is used to connect with patients for Virtual Visits (Telemedicine).  Patients are able to view lab/test results, encounter notes, upcoming appointments, etc.  Non-urgent messages can be sent to your provider as well.   To learn more about what you can do with MyChart, go to [email or web address].    Your next appointment:   4 week(s)  The format for your next appointment:   In Person  Provider:   Authur Cubit, DO   Other Instructions      Adopting a Healthy Lifestyle.  Know what a healthy weight is for you (roughly BMI <25) and aim to maintain this   Aim for 7+ servings of fruits and vegetables daily   65-80+ fluid ounces of water or unsweet tea for healthy kidneys   Limit to max 1 drink of alcohol per day; avoid smoking/tobacco   Limit animal fats in diet for cholesterol and heart health - choose grass fed whenever available   Avoid highly processed foods, and foods high in saturated/trans fats   Aim for low stress - take time to unwind and care for your mental health   Aim for 150 min of moderate intensity exercise weekly for heart health, and weights twice weekly for bone health   Aim for 7-9 hours of sleep daily   When it comes to diets, agreement about the perfect plan isnt easy to find, even among the experts. Experts at the [employer] developed an idea known as the Healthy Eating Plate. Just imagine a plate divided into logical, healthy portions.   The emphasis is on diet quality:   Load up on vegetables and fruits - one-half of your plate: Aim for color and variety, and remember that potatoes dont count.   Go for whole grains - one-quarter of your plate: Whole wheat, barley, wheat berries, quinoa, oats, brown rice, and foods made with them. If you want pasta, go with whole wheat pasta.   Protein power - one-quarter of your plate: Fish, chicken, beans, and nuts are all healthy, versatile protein sources. Limit red meat.   The diet,  however, does go beyond the plate, offering a few other suggestions.   Use healthy plant oils, such as olive, canola, soy, corn, sunflower and peanut. Check the labels, and avoid partially hydrogenated oil, which have unhealthy trans fats.   If youre thirsty, drink water. Coffee and tea are good in moderation, but skip sugary drinks and limit milk and dairy products to one or two daily servings.   The type of carbohydrate in the diet is more important than the amount. Some sources of carbohydrates, such as vegetables, fruits, whole grains, and beans-are healthier than others.   Finally, stay active  Signed, Tyrion Glaude, DO  12/12/2020 1:17 PM    [facility] 

## 2020-12-12 NOTE — H&P (View-Only) (Signed)
Cardiology Office Note:    Date:  12/12/2020   ID:  Dylan Page, DOB 1975-09-01, MRN [id number]  PCP:  Buckner Malta, MD  Cardiologist:  Thomasene Ripple, DO  Electrophysiologist:  None   Referring MD: Buckner Malta, MD     History of Present Illness:    Dylan Page is a 45 y.o. male with a hx of atrial fibrillation on metoprolol succinate as well as Eliquis, hypertension, recently diagnosed depressed ejection fraction on echocardiogram done in March 2022 showed EF of 30 to 35%,.  At his initial visit he reported that he post admission to [facility] on October 11, 2020 he went to see his PCP he was found to be atrial fibrillation with rapid ventricular rate he was then asked to go to the emergency department.  While in the hospital the patient was started on rate control agents.  And subsequently had an echocardiogram which showed a EF of 30 to 35% with global hypokinesis, mild to moderate mitral regurgitation.  At the conclusion of that visit the patient reported that he had worn a ZIO monitor.  He was in sinus rhythm that day I continued the patient on his Eliquis as well as his metoprolol succinate 150 mg daily.  For his dilated cardiomyopathy I stopped his lisinopril and transition the patient to [facility].  I also started patient on Aldactone 12.5 mg daily.  I recommended the patient get a sleep study and a sleep study was ordered.  I referred the patient to our advanced heart failure team but he has not heard from them yet.  He is here today for follow-up visit.  He tells me that his symptoms are improving.  He still does have some shortness of breath.  Recently the patient wife called that he was experiencing significant leg edema and I started him on Lasix 20 mg daily and they both tell me that helped tremendously.  He is here today for the visit with his wife Dylan Page.  Past Medical History:  Diagnosis Date  . ADHD   . Blind left eye   . GERD  (gastroesophageal reflux disease)   . Hypertension   . Insomnia     Past Surgical History:  Procedure Laterality Date  . ANKLE SURGERY Left 2003  . EYE SURGERY      Current Medications: Current Meds  Medication Sig  . alprazolam (XANAX) 2 MG tablet Take 2 mg by mouth at bedtime as needed for sleep.  [name] apixaban (ELIQUIS) 5 MG TABS tablet Take 5 mg by mouth 2 (two) times daily.  . cetirizine (ZYRTEC) 10 MG tablet Take 10 mg by mouth daily.  . dapagliflozin propanediol (FARXIGA) 10 MG TABS tablet Take 1 tablet (10 mg total) by mouth daily before breakfast.  . furosemide (LASIX) 40 MG tablet Take 1 tablet (40 mg total) by mouth daily.  . metoprolol succinate (TOPROL-XL) 50 MG 24 hr tablet Take 150 mg by mouth daily. Take 3 tablets daily (150mg ) Take with or immediately following a meal.  . naltrexone (DEPADE) 50 MG tablet Take 50 mg by mouth daily.  . sacubitril-valsartan (ENTRESTO) 24-26 MG Take 1 tablet by mouth 2 (two) times daily.  spironolactone (ALDACTONE) 25 MG tablet Take 0.5 tablets (12.5 mg total) by mouth daily.  . traZODone (DESYREL) 100 MG tablet Take 50 mg by mouth at bedtime. Take 1/2 tablet (50mg ) nightly     Allergies:   Patient has no known allergies.   Social History  Socioeconomic History  . Marital status: Unknown    Spouse name: Not on file  . Number of children: Not on file  . Years of education: Not on file  . Highest education level: Not on file  Occupational History  . Not on file  Tobacco Use  . Smoking status: Never Smoker  . Smokeless tobacco: Never Used  Vaping Use  . Vaping Use: Never used  Substance and Sexual Activity  . Alcohol use: Yes    Comment: Occasional  . Drug use: Never  . Sexual activity: Not on file  Other Topics Concern  . Not on file  Social History Narrative  . Not on file   Social Determinants of Health   Financial Resource Strain: Not on file  Food Insecurity: Not on file  Transportation Needs: Not on file   Physical Activity: Not on file  Stress: Not on file  Social Connections: Not on file     Family History: The patient's family history includes Heart attack in his father; Heart disease in his father.  ROS:   Review of Systems  Constitution: Negative for decreased appetite, fever and weight gain.  HENT: Negative for congestion, ear discharge, hoarse voice and sore throat.   Eyes: Negative for discharge, redness, vision loss in right eye and visual halos.  Cardiovascular: Negative for chest pain, dyspnea on exertion, leg swelling, orthopnea and palpitations.  Respiratory: Negative for cough, hemoptysis, shortness of breath and snoring.   Endocrine: Negative for heat intolerance and polyphagia.  Hematologic/Lymphatic: Negative for bleeding problem. Does not bruise/bleed easily.  Skin: Negative for flushing, nail changes, rash and suspicious lesions.  Musculoskeletal: Negative for arthritis, joint pain, muscle cramps, myalgias, neck pain and stiffness.  Gastrointestinal: Negative for abdominal pain, bowel incontinence, diarrhea and excessive appetite.  Genitourinary: Negative for decreased libido, genital sores and incomplete emptying.  Neurological: Negative for brief paralysis, focal weakness, headaches and loss of balance.  Psychiatric/Behavioral: Negative for altered mental status, depression and suicidal ideas.  Allergic/Immunologic: Negative for HIV exposure and persistent infections.    EKGs/Labs/Other Studies Reviewed:    The following studies were reviewed today:   EKG: None today  Transthoracic echocardiogram done in March 2022 There is severe concentric left ventricular hypertrophy with moderate  global hypokinesis and severe hypokinesis of the basal inferoseptal  and basal inferior segments. Considerable improvement in EF in post  PVC beats [post-extrasystolic potentiation], and beat to beat  variation in EF, suggests improvement in systolic function with rate   control and sinus rhythm restoration. Present EF around 30-35%.  Left ventricular filling pattern and LAP are indeterminate.  The right ventricle is normal size and systolic function.  Mildly dilated atria.  There is mild to moderate mitral regurgitation.  There is mild tricuspid regurgitation.  No pulmonary hypertension.  IVC size was normal.   There is no comparison study available.  -  FINDINGS:  LEFT VENTRICLE  The left ventricular size is normal. There is severe concentric left  ventricular hypertrophy. LV ejection fraction = 30-35%. Left  ventricular systolic function is moderately reduced. Left ventricular  filling pattern is indeterminate. There is moderate global hypokinesis  of the left ventricle.   -  RIGHT VENTRICLE  The right ventricle is normal size. The right ventricular systolic  function is normal.   LEFT ATRIUM  The left atrium is mildly dilated.   RIGHT ATRIUM  The right atrium is mildly dilated.  -  AORTIC VALVE  Structurally normal aortic valve. There is no  aortic stenosis. There  is trace aortic regurgitation.  -  MITRAL VALVE  The mitral valve leaflets appear normal. There is mild to moderate  mitral regurgitation.  -  TRICUSPID VALVE  Structurally normal tricuspid valve. There is mild tricuspid  regurgitation. No pulmonary hypertension.  -  PULMONIC VALVE  The pulmonic valve is not well visualized.  -  ARTERIES  The aortic sinus is normal size.  -  VENOUS  IVC size was normal.  -  EFFUSION  There is no pericardial effusion.   Recent Labs: 11/14/2020: BUN 6; Creatinine, Ser 0.80; Magnesium 1.8; Potassium 4.0; Sodium 140  Recent Lipid Panel No results found for: CHOL, TRIG, HDL, CHOLHDL, VLDL, LDLCALC, LDLDIRECT  Physical Exam:    VS:  BP 122/80   Pulse 70   Ht 5\' 11"  (1.803 m)   Wt 278 lb 6.4 oz (126.3 kg)   SpO2 98%   BMI 38.83 kg/m     Wt Readings from Last 3 Encounters:  12/12/20 278 lb 6.4 oz (126.3 kg)  11/14/20 267  lb 3.2 oz (121.2 kg)  10/11/20 267 lb (121.1 kg)     GEN: Well nourished, well developed in no acute distress HEENT: Normal NECK: No JVD; No carotid bruits LYMPHATICS: No lymphadenopathy CARDIAC: S1S2 noted,RRR, no murmurs, rubs, gallops RESPIRATORY:  Clear to auscultation without rales, wheezing or rhonchi  ABDOMEN: Soft, non-tender, non-distended, +bowel sounds, no guarding. EXTREMITIES: No edema, No cyanosis, no clubbing MUSCULOSKELETAL:  No deformity  SKIN: Warm and dry NEUROLOGIC:  Alert and oriented x 3, non-focal PSYCHIATRIC:  Normal affect, good insight  ASSESSMENT:    1. Dilated cardiomyopathy (HCC)   2. PAF (paroxysmal atrial fibrillation) (HCC)   3. Hypertension, unspecified type   4. Depressed left ventricular ejection fraction    PLAN:     He still does have some shortness of breath.  He has severely depressed ejection fraction which I started the patient on guideline directed medical therapy he is on Entresto 24-26 mg twice a day, he is on Aldactone 12.5 mg daily.  I am going to add Farxiga 10 mg today.  I do suspect that his cardiomyopathy may be tachycardia induced.  But what I would do given his shortness of breath as well and his risk factors have the patient get a left heart catheterization to rule out coronary artery disease.    If his heart cath is normal rhythm control will be our strategy and continue the patient on guideline directed medical therapy with a repeat echocardiogram.  His sleep study is planned for January 16, 2021.  We will get blood work today to assess his kidney function.  The patient his wife had a few questions about his current diagnosis to medications as well I was able to explain everything to them in the office visit.    His monitor which was placed by his PCP which he wore for 6 days no evidence of atrial fibrillation he was in sinus rhythm with his maximal heart rate going to 112 bpm however patient on significant high dose of  beta-blocker metoprolol succinate 150 mg daily.  No pauses he was in sinus rhythm the whole time.  The patient understands the need to lose weight with diet and exercise. We have discussed specific strategies for this.  The patient is in agreement with the above plan. The patient left the office in stable condition.  The patient will follow up in   Medication Adjustments/Labs and Tests Ordered: Current medicines are  reviewed at length with the patient today.  Concerns regarding medicines are outlined above.  Orders Placed This Encounter  Procedures  . Basic metabolic panel  . Magnesium  . CBC with Differential/Platelet   Meds ordered this encounter  Medications  . dapagliflozin propanediol (FARXIGA) 10 MG TABS tablet    Sig: Take 1 tablet (10 mg total) by mouth daily before breakfast.    Dispense:  90 tablet    Refill:  3  . atorvastatin (LIPITOR) 40 MG tablet    Sig: Take 1 tablet (40 mg total) by mouth daily.    Dispense:  90 tablet    Refill:  3    Patient Instructions  Medication Instructions:  Your physician has recommended you make the following change in your medication: START: Farxiga 10 mg daily *If you need a refill on your cardiac medications before your next appointment, please call your pharmacy*   Lab Work: Your physician recommends that you return for lab work: TODAY: BMET, Mag, CBC If you have labs (blood work) drawn today and your tests are completely normal, you will receive your results only by: [name] MyChart Message (if you have MyChart) OR . A paper copy in the mail If you have any lab test that is abnormal or we need to change your treatment, we will call you to review the results.   Testing/Procedures:    [facility] CARDIOVASCULAR DIVISION [facility] AT [place] [address] [place] [address] Dept: [phone] Loc: [phone]  Dylan Page  12/12/2020  You are scheduled for a Cardiac Catheterization  on Thursday, May 26 with Dr. Peter [place].  1. Please arrive at the [facility] (Main Entrance A) at [facility]: [address] [place], [place] [address] at 5:30 AM (This time is two hours before your procedure to ensure your preparation). Free valet parking service is available.   Special note: Every effort is made to have your procedure done on time. Please understand that emergencies sometimes delay scheduled procedures.  2. Diet: Do not eat solid foods after midnight.  The patient may have clear liquids until 5am upon the day of the procedure.  3. Labs: You will need to have blood drawn on TODAY.  4. Medication instructions in preparation for your procedure:   Contrast Allergy: No  Stop taking Eliquis (Apixiban) on Tuesday, May 24.   On the morning of your procedure, take your Aspirin and any morning medicines NOT listed above.  You may use sips of water.  5. Plan for one night stay--bring personal belongings. 6. Bring a current list of your medications and current insurance cards. 7. You MUST have a responsible person to drive you home. 8. Someone MUST be with you the first 24 hours after you arrive home or your discharge will be delayed. 9. Please wear clothes that are easy to get on and off and wear slip-on shoes.  Thank you for allowing [place] to care for you!   -- [facility]    Follow-Up: At [facility], you and your health needs are our priority.  As part of our continuing mission to provide you with exceptional heart care, we have created designated Provider Care Teams.  These Care Teams include your primary Cardiologist (physician) and Advanced Practice Providers (APPs -  Physician Assistants and Nurse Practitioners) who all work together to provide you with the care you need, when you need it.  We recommend signing up for the  patient portal called "MyChart".  Sign up information is provided on this After Visit Summary.  MyChart  is used to connect with patients for Virtual Visits (Telemedicine).  Patients are able to view lab/test results, encounter notes, upcoming appointments, etc.  Non-urgent messages can be sent to your provider as well.   To learn more about what you can do with MyChart, go to [email or web address].    Your next appointment:   4 week(s)  The format for your next appointment:   In Person  Provider:   Thomasene Ripple, DO   Other Instructions      Adopting a Healthy Lifestyle.  Know what a healthy weight is for you (roughly BMI <25) and aim to maintain this   Aim for 7+ servings of fruits and vegetables daily   65-80+ fluid ounces of water or unsweet tea for healthy kidneys   Limit to max 1 drink of alcohol per day; avoid smoking/tobacco   Limit animal fats in diet for cholesterol and heart health - choose grass fed whenever available   Avoid highly processed foods, and foods high in saturated/trans fats   Aim for low stress - take time to unwind and care for your mental health   Aim for 150 min of moderate intensity exercise weekly for heart health, and weights twice weekly for bone health   Aim for 7-9 hours of sleep daily   When it comes to diets, agreement about the perfect plan isnt easy to find, even among the experts. Experts at the [facility] [employer] developed an idea known as the Healthy Eating Plate. Just imagine a plate divided into logical, healthy portions.   The emphasis is on diet quality:   Load up on vegetables and fruits - one-half of your plate: Aim for color and variety, and remember that potatoes dont count.   Go for whole grains - one-quarter of your plate: Whole wheat, barley, wheat berries, quinoa, oats, brown rice, and foods made with them. If you want pasta, go with whole wheat pasta.   Protein power - one-quarter of your plate: Fish, chicken, beans, and nuts are all healthy, versatile protein sources. Limit red meat.   The diet,  however, does go beyond the plate, offering a few other suggestions.   Use healthy plant oils, such as olive, canola, soy, corn, sunflower and peanut. Check the labels, and avoid partially hydrogenated oil, which have unhealthy trans fats.   If youre thirsty, drink water. Coffee and tea are good in moderation, but skip sugary drinks and limit milk and dairy products to one or two daily servings.   The type of carbohydrate in the diet is more important than the amount. Some sources of carbohydrates, such as vegetables, fruits, whole grains, and beans-are healthier than others.   Finally, stay active  Signed, Thomasene Ripple, DO  12/12/2020 1:17 PM    [facility]

## 2020-12-13 LAB — CBC WITH DIFFERENTIAL/PLATELET
Basophils Absolute: 0.1 10*3/uL (ref 0.0–0.2)
Basos: 1 %
EOS (ABSOLUTE): 0 10*3/uL (ref 0.0–0.4)
Eos: 1 %
Hematocrit: 43.2 % (ref 37.5–51.0)
Hemoglobin: 12.2 g/dL — ABNORMAL LOW (ref 13.0–17.7)
Immature Grans (Abs): 0 10*3/uL (ref 0.0–0.1)
Immature Granulocytes: 1 %
Lymphocytes Absolute: 1.6 10*3/uL (ref 0.7–3.1)
Lymphs: 19 %
MCH: 19.6 pg — ABNORMAL LOW (ref 26.6–33.0)
MCHC: 28.2 g/dL — ABNORMAL LOW (ref 31.5–35.7)
MCV: 70 fL — ABNORMAL LOW (ref 79–97)
Monocytes Absolute: 0.7 10*3/uL (ref 0.1–0.9)
Monocytes: 8 %
NRBC: 1 % — ABNORMAL HIGH (ref 0–0)
Neutrophils Absolute: 6.1 10*3/uL (ref 1.4–7.0)
Neutrophils: 70 %
Platelets: 311 10*3/uL (ref 150–450)
RBC: 6.22 x10E6/uL — ABNORMAL HIGH (ref 4.14–5.80)
RDW: 18 % — ABNORMAL HIGH (ref 11.6–15.4)
WBC: 8.6 10*3/uL (ref 3.4–10.8)

## 2020-12-13 LAB — BASIC METABOLIC PANEL
BUN/Creatinine Ratio: 8 — ABNORMAL LOW (ref 9–20)
BUN: 10 mg/dL (ref 6–24)
CO2: 24 mmol/L (ref 20–29)
Calcium: 8.8 mg/dL (ref 8.7–10.2)
Chloride: 99 mmol/L (ref 96–106)
Creatinine, Ser: 1.23 mg/dL (ref 0.76–1.27)
Glucose: 88 mg/dL (ref 65–99)
Potassium: 4.3 mmol/L (ref 3.5–5.2)
Sodium: 137 mmol/L (ref 134–144)
eGFR: 75 mL/min/{1.73_m2} (ref >59)

## 2020-12-13 LAB — MAGNESIUM: Magnesium: 1.9 mg/dL (ref 1.6–2.3)

## 2020-12-19 ENCOUNTER — Other Ambulatory Visit: Payer: Self-pay

## 2020-12-19 DIAGNOSIS — I10 Essential (primary) hypertension: Secondary | ICD-10-CM

## 2020-12-19 DIAGNOSIS — I480 Paroxysmal atrial fibrillation: Secondary | ICD-10-CM

## 2020-12-19 DIAGNOSIS — I5020 Unspecified systolic (congestive) heart failure: Secondary | ICD-10-CM

## 2020-12-19 NOTE — Progress Notes (Signed)
Cbc

## 2020-12-20 LAB — BASIC METABOLIC PANEL
BUN/Creatinine Ratio: 9 (ref 9–20)
BUN: 8 mg/dL (ref 6–24)
CO2: 21 mmol/L (ref 20–29)
Calcium: 7.2 mg/dL — ABNORMAL LOW (ref 8.7–10.2)
Chloride: 76 mmol/L — ABNORMAL LOW (ref 96–106)
Creatinine, Ser: 0.88 mg/dL (ref 0.76–1.27)
Glucose: 61 mg/dL — ABNORMAL LOW (ref 65–99)
Potassium: 3.2 mmol/L — ABNORMAL LOW (ref 3.5–5.2)
Sodium: 139 mmol/L (ref 134–144)
eGFR: 84 mL/min/{1.73_m2} (ref >59)

## 2020-12-20 LAB — CBC
Hematocrit: 41 % (ref 37.5–51.0)
Hemoglobin: 11.6 g/dL — ABNORMAL LOW (ref 13.0–17.7)
MCH: 19.8 pg — ABNORMAL LOW (ref 26.6–33.0)
MCHC: 28.3 g/dL — ABNORMAL LOW (ref 31.5–35.7)
MCV: 70 fL — ABNORMAL LOW (ref 79–97)
Platelets: 294 10*3/uL (ref 150–450)
RBC: 5.87 x10E6/uL — ABNORMAL HIGH (ref 4.14–5.80)
RDW: 16.6 % — ABNORMAL HIGH (ref 11.6–15.4)
WBC: 6.2 10*3/uL (ref 3.4–10.8)

## 2020-12-22 DIAGNOSIS — I472 Ventricular tachycardia: Secondary | ICD-10-CM | POA: Diagnosis not present

## 2020-12-22 DIAGNOSIS — I480 Paroxysmal atrial fibrillation: Secondary | ICD-10-CM | POA: Diagnosis not present

## 2020-12-24 ENCOUNTER — Other Ambulatory Visit (HOSPITAL_COMMUNITY): Payer: BC Managed Care – PPO

## 2020-12-25 ENCOUNTER — Telehealth: Payer: Self-pay | Admitting: *Deleted

## 2020-12-25 NOTE — Telephone Encounter (Addendum)
Pt contacted pre-catheterization scheduled at [facility] for: Thursday Dec 26, 2020 7:30 AM Verified arrival time and place: [facility] Main Entrance A [facility]) at: 5:30 AM   No solid food after midnight prior to cath, clear liquids until 5 AM day of procedure.  Hold: Eliquis-none 12/24/20 until post procedure  Spironolactone-AM of procedure Lasix-AM of procedure Farxiga-AM of procedure  Except hold medications AM meds can be  taken pre-cath with sips of water including: ASA 81 mg   Confirmed patient has responsible adult to drive home post procedure and be with patient first 24 hours after arriving home: yes  You are allowed ONE visitor in the waiting room during the time you are at the hospital for your procedure. Both you and your visitor must wear a mask once you enter the hospital.   Patient reports does not currently have any symptoms concerning for COVID-19 and no household members with COVID-19 like illness.     Reviewed procedure/mask/visitor instructions with patient's wife (DPR).         Copied from 12/19/20 BMP results per Dr Servando Salina:     Potassium is low at 3.2. I would recommend increasing foods that is high in potassium.    Discussed increasing foods high in potassium with patient's wife.                        *Patient's wife reports that patient would have concerns about taking fentanyl or any opioid pain medications and asked me to forward this information to this                  hospital/cath lab.            I asked patient's wife to have patient discuss his concerns with staff at hospital tomorrow prior to cath.

## 2020-12-26 ENCOUNTER — Ambulatory Visit (HOSPITAL_COMMUNITY)
Admission: RE | Admit: 2020-12-26 | Discharge: 2020-12-26 | Disposition: A | Discharge status: Home / Self Care | Payer: BC Managed Care – PPO | Attending: Cardiology | Admitting: Cardiology

## 2020-12-26 ENCOUNTER — Encounter (HOSPITAL_COMMUNITY)
Admission: RE | Disposition: A | Discharge status: Home / Self Care | Payer: Self-pay | Source: Home / Self Care | Attending: Cardiology

## 2020-12-26 ENCOUNTER — Encounter (HOSPITAL_COMMUNITY): Payer: Self-pay | Admitting: Cardiology

## 2020-12-26 ENCOUNTER — Other Ambulatory Visit: Payer: Self-pay

## 2020-12-26 DIAGNOSIS — I10 Essential (primary) hypertension: Secondary | ICD-10-CM | POA: Diagnosis present

## 2020-12-26 DIAGNOSIS — Z7901 Long term (current) use of anticoagulants: Secondary | ICD-10-CM | POA: Diagnosis not present

## 2020-12-26 DIAGNOSIS — I110 Hypertensive heart disease with heart failure: Secondary | ICD-10-CM | POA: Insufficient documentation

## 2020-12-26 DIAGNOSIS — I420 Dilated cardiomyopathy: Secondary | ICD-10-CM | POA: Insufficient documentation

## 2020-12-26 DIAGNOSIS — I5020 Unspecified systolic (congestive) heart failure: Secondary | ICD-10-CM | POA: Diagnosis present

## 2020-12-26 DIAGNOSIS — Z79899 Other long term (current) drug therapy: Secondary | ICD-10-CM | POA: Diagnosis not present

## 2020-12-26 DIAGNOSIS — I480 Paroxysmal atrial fibrillation: Secondary | ICD-10-CM | POA: Insufficient documentation

## 2020-12-26 DIAGNOSIS — E669 Obesity, unspecified: Secondary | ICD-10-CM | POA: Diagnosis present

## 2020-12-26 DIAGNOSIS — I5021 Acute systolic (congestive) heart failure: Secondary | ICD-10-CM | POA: Diagnosis not present

## 2020-12-26 HISTORY — PX: RIGHT/LEFT HEART CATH AND CORONARY ANGIOGRAPHY: CATH118266

## 2020-12-26 LAB — POCT I-STAT EG7
Acid-Base Excess: 1 mmol/L (ref 0.0–2.0)
Acid-Base Excess: 1 mmol/L (ref 0.0–2.0)
Bicarbonate: 27.1 mmol/L (ref 20.0–28.0)
Bicarbonate: 27.5 mmol/L (ref 20.0–28.0)
Calcium, Ion: 1.15 mmol/L (ref 1.15–1.40)
Calcium, Ion: 1.18 mmol/L (ref 1.15–1.40)
HCT: 36 % — ABNORMAL LOW (ref 39.0–52.0)
HCT: 37 % — ABNORMAL LOW (ref 39.0–52.0)
Hemoglobin: 12.2 g/dL — ABNORMAL LOW (ref 13.0–17.0)
Hemoglobin: 12.6 g/dL — ABNORMAL LOW (ref 13.0–17.0)
O2 Saturation: 72 %
O2 Saturation: 72 %
Potassium: 3.5 mmol/L (ref 3.5–5.1)
Potassium: 3.5 mmol/L (ref 3.5–5.1)
Sodium: 141 mmol/L (ref 135–145)
Sodium: 143 mmol/L (ref 135–145)
TCO2: 29 mmol/L (ref 22–32)
TCO2: 29 mmol/L (ref 22–32)
pCO2, Ven: 49.5 mmHg (ref 44.0–60.0)
pCO2, Ven: 49.6 mmHg (ref 44.0–60.0)
pH, Ven: 7.345 (ref 7.250–7.430)
pH, Ven: 7.353 (ref 7.250–7.430)
pO2, Ven: 40 mmHg (ref 32.0–45.0)
pO2, Ven: 40 mmHg (ref 32.0–45.0)

## 2020-12-26 LAB — BASIC METABOLIC PANEL WITH GFR
Anion gap: 7 (ref 5–15)
BUN: 9 mg/dL (ref 6–20)
CO2: 26 mmol/L (ref 22–32)
Calcium: 8.2 mg/dL — ABNORMAL LOW (ref 8.9–10.3)
Chloride: 102 mmol/L (ref 98–111)
Creatinine, Ser: 1.26 mg/dL — ABNORMAL HIGH (ref 0.61–1.24)
GFR, Estimated: >60 mL/min
Glucose, Bld: 88 mg/dL (ref 70–99)
Potassium: 3.5 mmol/L (ref 3.5–5.1)
Sodium: 135 mmol/L (ref 135–145)

## 2020-12-26 LAB — POCT I-STAT 7, (LYTES, BLD GAS, ICA,H+H)
Acid-Base Excess: 1 mmol/L (ref 0.0–2.0)
Bicarbonate: 27.2 mmol/L (ref 20.0–28.0)
Calcium, Ion: 1.17 mmol/L (ref 1.15–1.40)
HCT: 37 % — ABNORMAL LOW (ref 39.0–52.0)
Hemoglobin: 12.6 g/dL — ABNORMAL LOW (ref 13.0–17.0)
O2 Saturation: 98 %
Potassium: 3.6 mmol/L (ref 3.5–5.1)
Sodium: 141 mmol/L (ref 135–145)
TCO2: 29 mmol/L (ref 22–32)
pCO2 arterial: 46.7 mmHg (ref 32.0–48.0)
pH, Arterial: 7.373 (ref 7.350–7.450)
pO2, Arterial: 105 mmHg (ref 83.0–108.0)

## 2020-12-26 SURGERY — RIGHT/LEFT HEART CATH AND CORONARY ANGIOGRAPHY
Anesthesia: LOCAL

## 2020-12-26 MED ORDER — SODIUM CHLORIDE 0.9% FLUSH: 3.0000 mL | INTRAVENOUS | Status: DC | PRN

## 2020-12-26 MED ORDER — SODIUM CHLORIDE 0.9 % IV SOLN: 250.0000 mL | INTRAVENOUS | Status: DC | PRN

## 2020-12-26 MED ORDER — VERAPAMIL HCL 2.5 MG/ML IV SOLN
INTRAVENOUS | Status: AC
  Filled 2020-12-26: qty 2

## 2020-12-26 MED ORDER — FENTANYL CITRATE (PF) 100 MCG/2ML IJ SOLN
INTRAMUSCULAR | Status: DC | PRN
  Administered 2020-12-26: 25 ug via INTRAVENOUS

## 2020-12-26 MED ORDER — ACETAMINOPHEN 325 MG PO TABS: 650.0000 mg | ORAL_TABLET | ORAL | Status: DC | PRN

## 2020-12-26 MED ORDER — FENTANYL CITRATE (PF) 100 MCG/2ML IJ SOLN
INTRAMUSCULAR | Status: AC
  Filled 2020-12-26: qty 2

## 2020-12-26 MED ORDER — SODIUM CHLORIDE 0.9 % IV SOLN: INTRAVENOUS | Status: DC

## 2020-12-26 MED ORDER — MIDAZOLAM HCL 2 MG/2ML IJ SOLN
INTRAMUSCULAR | Status: AC
  Filled 2020-12-26: qty 2

## 2020-12-26 MED ORDER — LIDOCAINE HCL (PF) 1 % IJ SOLN
INTRAMUSCULAR | Status: DC | PRN
  Administered 2020-12-26: 2 mL
  Administered 2020-12-26: 5 mL

## 2020-12-26 MED ORDER — HEPARIN (PORCINE) IN NACL 1000-0.9 UT/500ML-% IV SOLN
INTRAVENOUS | Status: DC | PRN
  Administered 2020-12-26 (×2): 500 mL

## 2020-12-26 MED ORDER — LIDOCAINE HCL (PF) 1 % IJ SOLN
INTRAMUSCULAR | Status: AC
  Filled 2020-12-26: qty 30

## 2020-12-26 MED ORDER — SODIUM CHLORIDE 0.9 % WEIGHT BASED INFUSION: 1.0000 mL/kg/h | INTRAVENOUS | Status: DC

## 2020-12-26 MED ORDER — MIDAZOLAM HCL 2 MG/2ML IJ SOLN
INTRAMUSCULAR | Status: DC | PRN
  Administered 2020-12-26: 1 mg via INTRAVENOUS

## 2020-12-26 MED ORDER — SODIUM CHLORIDE 0.9% FLUSH: 3.0000 mL | Freq: Two times a day (BID) | INTRAVENOUS | Status: DC

## 2020-12-26 MED ORDER — HEPARIN (PORCINE) IN NACL 1000-0.9 UT/500ML-% IV SOLN
INTRAVENOUS | Status: AC
  Filled 2020-12-26: qty 500

## 2020-12-26 MED ORDER — HEPARIN SODIUM (PORCINE) 1000 UNIT/ML IJ SOLN
INTRAMUSCULAR | Status: AC
  Filled 2020-12-26: qty 1

## 2020-12-26 MED ORDER — ASPIRIN 81 MG PO CHEW: 81.0000 mg | CHEWABLE_TABLET | ORAL | Status: DC

## 2020-12-26 MED ORDER — IOHEXOL 350 MG/ML SOLN
INTRAVENOUS | Status: DC | PRN
  Administered 2020-12-26: 45 mL

## 2020-12-26 MED ORDER — VERAPAMIL HCL 2.5 MG/ML IV SOLN
INTRAVENOUS | Status: DC | PRN
  Administered 2020-12-26: 10 mL via INTRA_ARTERIAL

## 2020-12-26 MED ORDER — HEPARIN SODIUM (PORCINE) 1000 UNIT/ML IJ SOLN
INTRAMUSCULAR | Status: DC | PRN
  Administered 2020-12-26: 6000 [IU] via INTRAVENOUS

## 2020-12-26 MED ORDER — ONDANSETRON HCL 4 MG/2ML IJ SOLN: 4.0000 mg | Freq: Four times a day (QID) | INTRAMUSCULAR | Status: DC | PRN

## 2020-12-26 SURGICAL SUPPLY — 12 items

## 2020-12-26 NOTE — Discharge Instructions (Addendum)
May resume Eliquis this evening   Radial Site Care  This sheet gives you information about how to care for yourself after your procedure. Your health care provider may also give you more specific instructions. If you have problems or questions, contact your health care provider. What can I expect after the procedure? After the procedure, it is common to have:  Bruising and tenderness at the catheter insertion area. Follow these instructions at home: Medicines  Take over-the-counter and prescription medicines only as told by your health care provider. Insertion site care  Follow instructions from your health care provider about how to take care of your insertion site. Make sure you: ? Wash your hands with soap and water before you change your bandage (dressing). If soap and water are not available, use hand sanitizer. ? Change your dressing as told by your health care provider. ? Leave stitches (sutures), skin glue, or adhesive strips in place. These skin closures may need to stay in place for 2 weeks or longer. If adhesive strip edges start to loosen and curl up, you may trim the loose edges. Do not remove adhesive strips completely unless your health care provider tells you to do that.  Check your insertion site every day for signs of infection. Check for: ? Redness, swelling, or pain. ? Fluid or blood. ? Pus or a bad smell. ? Warmth.  Do not take baths, swim, or use a hot tub until your health care provider approves.  You may shower 24-48 hours after the procedure, or as directed by your health care provider. ? Remove the dressing and gently wash the site with plain soap and water. ? Pat the area dry with a clean towel. ? Do not rub the site. That could cause bleeding.  Do not apply powder or lotion to the site. Activity  For 24 hours after the procedure, or as directed by your health care provider: ? Do not flex or bend the affected arm. ? Do not push or pull heavy objects  with the affected arm. ? Do not drive yourself home from the hospital or clinic. You may drive 24 hours after the procedure unless your health care provider tells you not to. ? Do not operate machinery or power tools.  Do not lift anything that is heavier than 10 lb (4.5 kg), or the limit that you are told, until your health care provider says that it is safe.  Ask your health care provider when it is okay to: ? Return to work or school. ? Resume usual physical activities or sports. ? Resume sexual activity.   General instructions  If the catheter site starts to bleed, raise your arm and put firm pressure on the site. If the bleeding does not stop, get help right away. This is a medical emergency.  If you went home on the same day as your procedure, a responsible adult should be with you for the first 24 hours after you arrive home.  Keep all follow-up visits as told by your health care provider. This is important. Contact a health care provider if:  You have a fever.  You have redness, swelling, or yellow drainage around your insertion site. Get help right away if:  You have unusual pain at the radial site.  The catheter insertion area swells very fast.  The insertion area is bleeding, and the bleeding does not stop when you hold steady pressure on the area.  Your arm or hand becomes pale, cool, tingly, or  numb. These symptoms may represent a serious problem that is an emergency. Do not wait to see if the symptoms will go away. Get medical help right away. Call your local emergency services (911 in the [place] Do not drive yourself to the hospital. Summary  After the procedure, it is common to have bruising and tenderness at the site.  Follow instructions from your health care provider about how to take care of your radial site wound. Check the wound every day for signs of infection.  Do not lift anything that is heavier than 10 lb (4.5 kg), or the limit that you are told, until  your health care provider says that it is safe. This information is not intended to replace advice given to you by your health care provider. Make sure you discuss any questions you have with your health care provider. Document Revised: 08/25/2017 Document Reviewed: 08/25/2017 Elsevier Patient Education  2021 [employer].

## 2020-12-26 NOTE — Interval H&P Note (Signed)
History and Physical Interval Note:  12/26/2020 7:36 AM  Dylan Page  has presented today for surgery, with the diagnosis of depressed ejection fraction.  The various methods of treatment have been discussed with the patient and family. After consideration of risks, benefits and other options for treatment, the patient has consented to  Procedure(s): RIGHT/LEFT HEART CATH AND CORONARY ANGIOGRAPHY (N/A) as a surgical intervention.  The patient's history has been reviewed, patient examined, no change in status, stable for surgery.  I have reviewed the patient's chart and labs.  Questions were answered to the patient's satisfaction.   Cath Lab Visit (complete for each Cath Lab visit)  Clinical Evaluation Leading to the Procedure:   ACS: No.  Non-ACS:    Anginal Classification: CCS II  Anti-ischemic medical therapy: Minimal Therapy (1 class of medications)  Non-Invasive Test Results: No non-invasive testing performed  Prior CABG: No previous CABG        Theron Arista [facility] 12/26/2020 7:36 AM

## 2020-12-27 ENCOUNTER — Telehealth: Payer: Self-pay

## 2020-12-27 NOTE — Telephone Encounter (Signed)
Spoke with patient's wife about his lab results. She verbalizes understanding. No further questions or concerns at this time.

## 2021-01-16 ENCOUNTER — Encounter (HOSPITAL_BASED_OUTPATIENT_CLINIC_OR_DEPARTMENT_OTHER): Payer: BC Managed Care – PPO | Admitting: Cardiovascular Disease

## 2021-01-22 ENCOUNTER — Ambulatory Visit: Payer: BC Managed Care – PPO | Admitting: Cardiology

## 2021-01-24 ENCOUNTER — Telehealth: Payer: Self-pay

## 2021-01-24 ENCOUNTER — Encounter: Payer: Self-pay | Admitting: Cardiology

## 2021-01-24 MED ORDER — FUROSEMIDE 40 MG PO TABS: 40.0000 mg | ORAL_TABLET | Freq: Every day | ORAL | 9 refills | Status: DC

## 2021-01-24 NOTE — Telephone Encounter (Signed)
Refill sent to pharmacy.   

## 2021-01-31 ENCOUNTER — Ambulatory Visit (HOSPITAL_BASED_OUTPATIENT_CLINIC_OR_DEPARTMENT_OTHER): Payer: BC Managed Care – PPO | Admitting: Cardiovascular Disease

## 2021-03-10 ENCOUNTER — Telehealth: Payer: Self-pay | Admitting: Cardiology

## 2021-03-10 DIAGNOSIS — R5383 Other fatigue: Secondary | ICD-10-CM

## 2021-03-10 DIAGNOSIS — R0989 Other specified symptoms and signs involving the circulatory and respiratory systems: Secondary | ICD-10-CM

## 2021-03-10 NOTE — Telephone Encounter (Signed)
would like to know what a normal resting heart rate should be for her husband.. states that last night it was between 40-70.[name] also needs to complete sleep study. Also states that pt has an ongoing cough and would like to advise as well

## 2021-03-11 MED ORDER — FUROSEMIDE 40 MG PO TABS: 40.0000 mg | ORAL_TABLET | Freq: Two times a day (BID) | ORAL | 3 refills | Status: DC

## 2021-03-11 NOTE — Telephone Encounter (Signed)
pts wife on the line following up on a phone note left yesterday in regards to her husbands resting heart rate... please advise

## 2021-03-11 NOTE — Addendum Note (Signed)
Addended by: Reynolds Bowl on: 03/11/2021 07:02 PM   Modules accepted: Orders

## 2021-03-11 NOTE — Telephone Encounter (Addendum)
Spoke with patient's wife. She told me about her husband's symptoms and asked questions about medication changes. Will relay message to Dr. Servando Salina.

## 2021-03-11 NOTE — Telephone Encounter (Signed)
Called patient's wife back, she states they have plans out of town and will not be able to come to get the blood work Dr. Servando Salina is requesting tomorrow. They state "there is not any swelling, he just feels like he is retaining water." Dr. Servando Salina wants a repeat echo and labs drawn.  Orders placed. The patient and his wife verbalized understanding.

## 2021-03-12 ENCOUNTER — Telehealth: Payer: Self-pay

## 2021-03-12 NOTE — Telephone Encounter (Signed)
Left message for patient to return the call about his Sleep study appointment on Oct 8th.

## 2021-03-25 DIAGNOSIS — R21 Rash and other nonspecific skin eruption: Secondary | ICD-10-CM | POA: Diagnosis not present

## 2021-03-25 DIAGNOSIS — I4891 Unspecified atrial fibrillation: Secondary | ICD-10-CM | POA: Diagnosis not present

## 2021-03-25 DIAGNOSIS — M5136 Other intervertebral disc degeneration, lumbar region: Secondary | ICD-10-CM | POA: Diagnosis not present

## 2021-03-25 DIAGNOSIS — I5020 Unspecified systolic (congestive) heart failure: Secondary | ICD-10-CM | POA: Diagnosis not present

## 2021-03-25 DIAGNOSIS — F4329 Adjustment disorder with other symptoms: Secondary | ICD-10-CM | POA: Diagnosis not present

## 2021-03-31 DIAGNOSIS — M4316 Spondylolisthesis, lumbar region: Secondary | ICD-10-CM | POA: Diagnosis not present

## 2021-03-31 DIAGNOSIS — M47896 Other spondylosis, lumbar region: Secondary | ICD-10-CM | POA: Diagnosis not present

## 2021-03-31 DIAGNOSIS — S32010A Wedge compression fracture of first lumbar vertebra, initial encounter for closed fracture: Secondary | ICD-10-CM | POA: Diagnosis not present

## 2021-03-31 DIAGNOSIS — M545 Low back pain, unspecified: Secondary | ICD-10-CM | POA: Diagnosis not present

## 2021-03-31 DIAGNOSIS — M7918 Myalgia, other site: Secondary | ICD-10-CM | POA: Diagnosis not present

## 2021-04-01 ENCOUNTER — Ambulatory Visit (INDEPENDENT_AMBULATORY_CARE_PROVIDER_SITE_OTHER): Payer: BC Managed Care – PPO | Admitting: Cardiology

## 2021-04-01 ENCOUNTER — Encounter: Payer: Self-pay | Admitting: Cardiology

## 2021-04-01 ENCOUNTER — Other Ambulatory Visit: Payer: Self-pay

## 2021-04-01 VITALS — BP 140/88 | HR 72 | Ht 70.0 in | Wt 275.8 lb

## 2021-04-01 DIAGNOSIS — R0683 Snoring: Secondary | ICD-10-CM

## 2021-04-01 DIAGNOSIS — I5020 Unspecified systolic (congestive) heart failure: Secondary | ICD-10-CM

## 2021-04-01 DIAGNOSIS — I10 Essential (primary) hypertension: Secondary | ICD-10-CM | POA: Diagnosis not present

## 2021-04-01 DIAGNOSIS — E669 Obesity, unspecified: Secondary | ICD-10-CM

## 2021-04-01 DIAGNOSIS — R5383 Other fatigue: Secondary | ICD-10-CM

## 2021-04-01 DIAGNOSIS — I480 Paroxysmal atrial fibrillation: Secondary | ICD-10-CM

## 2021-04-01 DIAGNOSIS — R0989 Other specified symptoms and signs involving the circulatory and respiratory systems: Secondary | ICD-10-CM

## 2021-04-01 NOTE — Patient Instructions (Addendum)
Medication Instructions:  Your physician has recommended you make the following change in your medication:   Stop Lipitor.  *If you need a refill on your cardiac medications before your next appointment, please call your pharmacy*   Lab Work: Your physician recommends that you have a BMET, magnesium, LFT and lipids today in the office.  If you have labs (blood work) drawn today and your tests are completely normal, you will receive your results only by: MyChart Message (if you have MyChart) OR A paper copy in the mail If you have any lab test that is abnormal or we need to change your treatment, we will call you to review the results.   Testing/Procedures: None ordered   Follow-Up: At [facility], you and your health needs are our priority.  As part of our continuing mission to provide you with exceptional heart care, we have created designated Provider Care Teams.  These Care Teams include your primary Cardiologist (physician) and Advanced Practice Providers (APPs -  Physician Assistants and Nurse Practitioners) who all work together to provide you with the care you need, when you need it.  We recommend signing up for the patient portal called "MyChart".  Sign up information is provided on this After Visit Summary.  MyChart is used to connect with patients for Virtual Visits (Telemedicine).  Patients are able to view lab/test results, encounter notes, upcoming appointments, etc.  Non-urgent messages can be sent to your provider as well.   To learn more about what you can do with MyChart, go to [email or web address].    Your next appointment:   12 week(s)  The format for your next appointment:   virtual  Provider:   Thomasene Ripple, MD   Other Instructions NA

## 2021-04-01 NOTE — Addendum Note (Signed)
Addended by: Eleonore Chiquito on: 04/01/2021 10:48 AM   Modules accepted: Orders

## 2021-04-01 NOTE — Progress Notes (Signed)
Cardiology Office Note:    Date:  04/01/2021   ID:  Dylan Page, DOB 11/05/1975, MRN [id number]  PCP:  Dylan Page, Jennifer, MD  Cardiologist:  Dylan RippleKardie Fontella Shan, DO  Electrophysiologist:  None   Referring MD: Dylan Page, Jennifer, MD   " I am experiencing great deal of back pain"  History of Present Illness:    Dylan Page is a 45 y.o. male with a hx of paroxysmal  atrial fibrillation on metoprolol succinate as well as Eliquis, hypertension, recently diagnosed depressed ejection fraction on echocardiogram done in March 2022 showed EF of 30 to 35%,.   At his initial visit he reported that he post admission to [facility] on October 11, 2020 he went to see his PCP he was found to be atrial fibrillation with rapid ventricular rate he was then asked to go to the emergency department.  While in the hospital the patient was started on rate control agents.  And subsequently had an echocardiogram which showed a EF of 30 to 35% with global hypokinesis, mild to moderate mitral regurgitation.   At the conclusion of that visit the patient reported that he had worn a ZIO monitor.  He was in sinus rhythm that day I continued the patient on his Eliquis as well as his metoprolol succinate 150 mg daily.  For his dilated cardiomyopathy I stopped his lisinopril and transition the [facility].  I also started patient on Aldactone 12.5 mg daily.  I recommended the patient get a sleep study and a sleep study was ordered.  I referred the patient to our advanced heart failure but unfortunately he was unable to schedule a visit.  I saw the patient on Dec 12, 2020 at that time I continued his Aldactone and Entresto as well as added his [place].  He was on Toprol-XL we will continue that medication.  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  He wore a ZIO monitor with did not  show any evidence of arrhythmia.  He was scheduled for sleep study in June but it appears that the patient did not get this done.  In the interim I got a call from the patient's wife reporting that his heart rate was dropping and he has increased fluid retention.  We cut back on his beta-blocker and increase his Lasix to 40 mg twice a day.  Today he is here for follow-up visit.  He tells me that he has been experiencing significant back pain.  [place]  He is in pain today.  Past Medical History:  Diagnosis Date   ADHD    Atrial fibrillation with RVR (HCC) 10/11/2020   Blind left eye    Depressed left ventricular ejection fraction 11/14/2020   Fatigue 11/14/2020   Generalized anxiety disorder 10/12/2020   GERD (gastroesophageal reflux disease)    HFrEF (heart failure with reduced ejection fraction) (HCC) 11/14/2020   Hypertension    Insomnia    Obesity (BMI 30-39.9) 11/14/2020   PAF (paroxysmal atrial fibrillation) (HCC) 11/14/2020   Primary hypertension 10/12/2020   Snoring 11/14/2020    Past Surgical History:  Procedure Laterality Date   ANKLE SURGERY Left 2003   EYE SURGERY     RIGHT/LEFT HEART CATH AND CORONARY ANGIOGRAPHY N/A 12/26/2020   Procedure: RIGHT/LEFT HEART CATH AND CORONARY ANGIOGRAPHY;  Surgeon: Dylan Page, Dylan M, MD;  [facility];  Service: Cardiovascular;  Laterality: N/A;    Current Medications: Current Meds  Medication Sig   acetaminophen (TYLENOL) 500 MG tablet Take 1,000 mg by mouth every 8 (eight) hours as needed for moderate pain.   alprazolam (XANAX) 2 MG tablet Take 2 mg by mouth at bedtime as needed for sleep.   apixaban (ELIQUIS) 5 MG TABS tablet Take 5 mg by mouth 2 (two) times daily.   atorvastatin (LIPITOR) 40 MG tablet Take 1 tablet (40 mg total) by mouth daily.   cetirizine (ZYRTEC) 10 MG tablet Take 10 mg by mouth daily as needed for allergies.   dapagliflozin propanediol (FARXIGA) 10 MG TABS  tablet Take 1 tablet (10 mg total) by mouth daily before breakfast.   furosemide (LASIX) 40 MG tablet Take 1 tablet (40 mg total) by mouth 2 (two) times daily.   metoprolol succinate (TOPROL-XL) 50 MG 24 hr tablet Take 50 mg by mouth 2 (two) times daily.   predniSONE (STERAPRED UNI-PAK 21 TAB) 10 MG (21) TBPK tablet Take 10 mg by mouth daily.   sacubitril-valsartan (ENTRESTO) 24-26 MG Take 1 tablet by mouth 2 (two) times daily.   spironolactone (ALDACTONE) 25 MG tablet Take 12.5 mg by mouth daily.   tiZANidine (ZANAFLEX) 4 MG tablet Take 4 mg by mouth 3 (three) times daily.   traZODone (DESYREL) 100 MG tablet Take 50 mg by mouth at bedtime. Take 1/2 tablet (50mg ) nightly   triamcinolone ointment (KENALOG) 0.1 % Apply 1 application topically as needed for rash.     Allergies:   Patient has no known allergies.   Social History   Socioeconomic History   Marital status: Married    Spouse name: Not on file   Number of children: Not on file   Years of education: Not on file   Highest education level: Not on file  Occupational History   Not on file  Tobacco Use   Smoking status: Never   Smokeless tobacco: Never  Vaping Use   Vaping Use: Never used  Substance and Sexual Activity   Alcohol use: Yes    Comment: Occasional   Drug use: Never   Sexual activity: Not on file  Other Topics Concern   Not on file  Social History Narrative   Not on file   Social Determinants of Health   Financial Resource Strain: Not on file  Food Insecurity: Not on file  Transportation Needs: Not on file  Physical Activity: Not on file  Stress: Not on file  Social Connections: Not on file     Family History: The patient's family history includes Heart attack in his father; Heart disease in his father.  ROS:   Review of Systems  Constitution: Negative for decreased appetite, fever and weight gain.  HENT: Negative for congestion, ear discharge, hoarse voice and sore throat.   Eyes: Negative for  discharge, redness, vision loss in right eye and visual halos.  Cardiovascular: Negative for chest pain, dyspnea on exertion, leg swelling, orthopnea and palpitations.  Respiratory: Negative for cough, hemoptysis, shortness of breath and snoring.   Endocrine: Negative for heat intolerance and polyphagia.  Hematologic/Lymphatic: Negative for bleeding problem. Does not bruise/bleed easily.  Skin: Negative for flushing, nail changes, rash and suspicious lesions.  Musculoskeletal: Negative for arthritis, joint pain, muscle cramps, myalgias, neck pain and stiffness.  Gastrointestinal: Negative for abdominal pain, bowel incontinence, diarrhea and excessive appetite.  Genitourinary: Negative for decreased libido, genital sores and incomplete emptying.  Neurological: Negative for brief  paralysis, focal weakness, headaches and loss of balance.  Psychiatric/Behavioral: Negative for altered mental status, depression and suicidal ideas.  Allergic/Immunologic: Negative for HIV exposure and persistent infections.    EKGs/Labs/Other Studies Reviewed:    The following studies were reviewed today:   EKG: None today  Transthoracic echocardiogram done in March 2022 There is severe concentric left ventricular hypertrophy with moderate  global hypokinesis and severe hypokinesis of the basal inferoseptal  and basal inferior segments. Considerable improvement in EF in post  PVC beats [post-extrasystolic potentiation], and beat to beat  variation in EF, suggests improvement in systolic function with rate  control and sinus rhythm restoration. Present EF around 30-35%.  Left ventricular filling pattern and LAP are indeterminate.  The right ventricle is normal size and systolic function.  Mildly dilated atria.  There is mild to moderate mitral regurgitation.  There is mild tricuspid regurgitation.  No pulmonary hypertension.  IVC size was normal.   There is no comparison study available.  -  FINDINGS:   LEFT VENTRICLE  The left ventricular size is normal. There is severe concentric left  ventricular hypertrophy. LV ejection fraction = 30-35%. Left  ventricular systolic function is moderately reduced. Left ventricular  filling pattern is indeterminate. There is moderate global hypokinesis  of the left ventricle.   -  RIGHT VENTRICLE  The right ventricle is normal size. The right ventricular systolic  function is normal.   LEFT ATRIUM  The left atrium is mildly dilated.   RIGHT ATRIUM  The right atrium is mildly dilated.  -  AORTIC VALVE  Structurally normal aortic valve. There is no aortic stenosis. There  is trace aortic regurgitation.  -  MITRAL VALVE  The mitral valve leaflets appear normal. There is mild to moderate  mitral regurgitation.  -  TRICUSPID VALVE  Structurally normal tricuspid valve. There is mild tricuspid  regurgitation. No pulmonary hypertension.  -  PULMONIC VALVE  The pulmonic valve is not well visualized.  -  ARTERIES  The aortic sinus is normal size.  -  VENOUS  IVC size was normal.  -  EFFUSION  There is no pericardial effusion.   Recent Labs: 12/12/2020: Magnesium 1.9 12/19/2020: Platelets 294 12/26/2020: BUN 9; Creatinine, Ser 1.26; Hemoglobin 12.2; Hemoglobin 12.6; Potassium 3.5; Potassium 3.5; Sodium 141; Sodium 143  Recent Lipid Panel No results found for: CHOL, TRIG, HDL, CHOLHDL, VLDL, LDLCALC, LDLDIRECT  Physical Exam:    VS:  BP 140/88   Pulse 72   Ht 5\' 10"  (1.778 Page)   Wt 275 lb 12.8 oz (125.1 kg)   SpO2 97%   BMI 39.57 kg/Page     Wt Readings from Last 3 Encounters:  04/01/21 275 lb 12.8 oz (125.1 kg)  12/26/20 275 lb (124.7 kg)  12/12/20 278 lb 6.4 oz (126.3 kg)     GEN: Well nourished, well developed in no acute distress HEENT: Normal NECK: No JVD; No carotid bruits LYMPHATICS: No lymphadenopathy CARDIAC: S1S2 noted,RRR, no murmurs, rubs, gallops RESPIRATORY:  Clear to auscultation without rales, wheezing or  rhonchi  ABDOMEN: Soft, non-tender, non-distended, +bowel sounds, no guarding. EXTREMITIES: No edema, No cyanosis, no clubbing MUSCULOSKELETAL:  No deformity  SKIN: Warm and dry NEUROLOGIC:  Alert and oriented x 3, non-focal PSYCHIATRIC:  Normal affect, good insight  ASSESSMENT:    1. PAF (paroxysmal atrial fibrillation) (HCC)   2. Primary hypertension   3. HFrEF (heart failure with reduced ejection fraction) (HCC)   4. Obesity (BMI 30-39.9)   5. Snoring  6. Depressed left ventricular ejection fraction   7. Other fatigue    PLAN:     1.  His blood pressure slightly elevated today.  But the patient tells me that he is in a great deal of pain.  I suspect that this is affecting his normal.  We will continue his current medication regimen.  He has responded favorably to the increased dose of Lasix.  And his resting heart rate has improved since cutting back on the beta-blocker. 2.  His echocardiogram to assess his LV function on guideline medical therapy as pending for this Thursday.  I am hoping that his EF has improved if not we will increase his Entresto.  At which time I again encouraged the patient to follow-up with our advanced heart failure team as well. 3.  He still needs a sleep study  The patient is in agreement with the above plan. The patient left the office in stable condition.  The patient will follow up in 12 weeks virtually   Medication Adjustments/Labs and Tests Ordered: Current medicines are reviewed at length with the patient today.  Concerns regarding medicines are outlined above.  No orders of the defined types were placed in this encounter.  No orders of the defined types were placed in this encounter.   There are no Patient Instructions on file for this visit.   Adopting a Healthy Lifestyle.  Know what a healthy weight is for you (roughly BMI <25) and aim to maintain this   Aim for 7+ servings of fruits and vegetables daily   65-80+ fluid ounces of water  or unsweet tea for healthy kidneys   Limit to max 1 drink of alcohol per day; avoid smoking/tobacco   Limit animal fats in diet for cholesterol and heart health - choose grass fed whenever available   Avoid highly processed foods, and foods high in saturated/trans fats   Aim for low stress - take time to unwind and care for your mental health   Aim for 150 min of moderate intensity exercise weekly for heart health, and weights twice weekly for bone health   Aim for 7-9 hours of sleep daily   When it comes to diets, agreement about the perfect plan isnt easy to find, even among the experts. Experts at the [facility] of [employer] developed an idea known as the Healthy Eating Plate. Just imagine a plate divided into logical, healthy portions.   The emphasis is on diet quality:   Load up on vegetables and fruits - one-half of your plate: Aim for color and variety, and remember that potatoes dont count.   Go for whole grains - one-quarter of your plate: Whole wheat, barley, wheat berries, quinoa, oats, brown rice, and foods made with them. If you want pasta, go with whole wheat pasta.   Protein power - one-quarter of your plate: Fish, chicken, beans, and nuts are all healthy, versatile protein sources. Limit red meat.   The diet, however, does go beyond the plate, offering a few other suggestions.   Use healthy plant oils, such as olive, canola, soy, corn, sunflower and peanut. Check the labels, and avoid partially hydrogenated oil, which have unhealthy trans fats.   If youre thirsty, drink water. Coffee and tea are good in moderation, but skip sugary drinks and limit milk and dairy products to one or two daily servings.   The type of carbohydrate in the diet is more important than the amount. Some sources of carbohydrates, such as  vegetables, fruits, whole grains, and beans-are healthier than others.   Finally, stay active  Signed, Dylan Ripple, DO  04/01/2021 10:43 AM     [facility]

## 2021-04-03 ENCOUNTER — Ambulatory Visit (INDEPENDENT_AMBULATORY_CARE_PROVIDER_SITE_OTHER): Payer: BC Managed Care – PPO

## 2021-04-03 ENCOUNTER — Other Ambulatory Visit: Payer: Self-pay

## 2021-04-03 DIAGNOSIS — R0989 Other specified symptoms and signs involving the circulatory and respiratory systems: Secondary | ICD-10-CM

## 2021-04-03 LAB — ECHOCARDIOGRAM COMPLETE
Area-P 1/2: 2.22 cm2
Calc EF: 45.5 %
S' Lateral: 3.5 cm
Single Plane A2C EF: 42.3 %
Single Plane A4C EF: 47.3 %

## 2021-04-04 LAB — LIPID PANEL
Chol/HDL Ratio: 3.3 ratio (ref 0.0–5.0)
Cholesterol, Total: 131 mg/dL (ref 100–199)
HDL: 40 mg/dL (ref >39)
LDL Chol Calc (NIH): 80 mg/dL (ref 0–99)
Triglycerides: 51 mg/dL (ref 0–149)
VLDL Cholesterol Cal: 11 mg/dL (ref 5–40)

## 2021-04-04 LAB — BASIC METABOLIC PANEL
BUN/Creatinine Ratio: 9 (ref 9–20)
BUN: 12 mg/dL (ref 6–24)
CO2: 22 mmol/L (ref 20–29)
Calcium: 8.9 mg/dL (ref 8.7–10.2)
Chloride: 97 mmol/L (ref 96–106)
Creatinine, Ser: 1.4 mg/dL — ABNORMAL HIGH (ref 0.76–1.27)
Glucose: 104 mg/dL — ABNORMAL HIGH (ref 65–99)
Potassium: 4.1 mmol/L (ref 3.5–5.2)
Sodium: 136 mmol/L (ref 134–144)
eGFR: 64 mL/min/{1.73_m2} (ref >59)

## 2021-04-04 LAB — HEPATIC FUNCTION PANEL
ALT: 35 IU/L (ref 0–44)
AST: 16 IU/L (ref 0–40)
Albumin: 4.3 g/dL (ref 4.0–5.0)
Alkaline Phosphatase: 53 IU/L (ref 44–121)
Bilirubin Total: 0.7 mg/dL (ref 0.0–1.2)
Bilirubin, Direct: 0.17 mg/dL (ref 0.00–0.40)
Total Protein: 7.2 g/dL (ref 6.0–8.5)

## 2021-04-04 LAB — MAGNESIUM: Magnesium: 1.9 mg/dL (ref 1.6–2.3)

## 2021-04-08 ENCOUNTER — Other Ambulatory Visit: Payer: Self-pay

## 2021-04-08 ENCOUNTER — Telehealth: Payer: Self-pay | Admitting: Cardiology

## 2021-04-08 MED ORDER — RIVAROXABAN 20 MG PO TABS: 20.0000 mg | ORAL_TABLET | Freq: Every day | ORAL | 3 refills | Status: DC

## 2021-04-08 NOTE — Progress Notes (Signed)
Prescription sent to pharmacy.

## 2021-04-08 NOTE — Telephone Encounter (Signed)
  Pt's wife returning call to get result

## 2021-04-09 ENCOUNTER — Other Ambulatory Visit: Payer: Self-pay

## 2021-04-09 DIAGNOSIS — I719 Aortic aneurysm of unspecified site, without rupture: Secondary | ICD-10-CM

## 2021-04-09 MED ORDER — APIXABAN 5 MG PO TABS: 5.0000 mg | ORAL_TABLET | Freq: Two times a day (BID) | ORAL | 3 refills | Status: DC

## 2021-04-09 NOTE — Progress Notes (Signed)
Orders placed.

## 2021-04-09 NOTE — Telephone Encounter (Signed)
Returned call, see chart.

## 2021-04-18 DIAGNOSIS — B3781 Candidal esophagitis: Secondary | ICD-10-CM | POA: Diagnosis not present

## 2021-04-18 DIAGNOSIS — Z13220 Encounter for screening for lipoid disorders: Secondary | ICD-10-CM | POA: Diagnosis not present

## 2021-04-18 DIAGNOSIS — B370 Candidal stomatitis: Secondary | ICD-10-CM | POA: Diagnosis not present

## 2021-04-18 DIAGNOSIS — M545 Low back pain, unspecified: Secondary | ICD-10-CM | POA: Diagnosis not present

## 2021-04-18 DIAGNOSIS — Z131 Encounter for screening for diabetes mellitus: Secondary | ICD-10-CM | POA: Diagnosis not present

## 2021-04-18 DIAGNOSIS — Z0000 Encounter for general adult medical examination without abnormal findings: Secondary | ICD-10-CM | POA: Diagnosis not present

## 2021-04-22 ENCOUNTER — Other Ambulatory Visit: Payer: BC Managed Care – PPO

## 2021-04-25 ENCOUNTER — Telehealth: Payer: Self-pay | Admitting: Cardiology

## 2021-04-25 ENCOUNTER — Other Ambulatory Visit: Payer: Self-pay

## 2021-04-25 NOTE — Telephone Encounter (Signed)
Patient's daughter stopped by to pick up samples at this time.

## 2021-04-25 NOTE — Telephone Encounter (Signed)
Tried calling patient. No answer and no voicemail set up for me to leave a message. 

## 2021-04-25 NOTE — Telephone Encounter (Signed)
Patient calling the office for samples of medication:   1.  What medication and dosage are you requesting samples for? ELIQUIS  2.  Are you currently out of this medication? PT IS CURRENTLY OUT OF THIS MEDICINE, WIFE SHARA STATES SHE WILL NEED TO PICK THEM UP TODAY. 2 BOXES

## 2021-05-02 ENCOUNTER — Other Ambulatory Visit: Payer: BC Managed Care – PPO

## 2021-05-05 ENCOUNTER — Telehealth: Payer: Self-pay | Admitting: Cardiology

## 2021-05-05 NOTE — Telephone Encounter (Signed)
Called and spoke with pt's wife. She states they called last week stating her is now stage 3 kidney failure. "What can we do? It seems like I have to choose between him taking medications for his heart or him being put on dialysis. I just want her to go through his medications and see what we can change. I want him to have the best medication for him. The kidney doctor understands he needs to take the medications but can we decrease some medications. She states he had his kidneys checked before starting medications, and after 6 months his kidneys are failing him." "He feels worst with the medications, they are supposed to make you feel better." [name] was about to get some medications for the next few months but are still having trouble getting the insurance to approve them. Will route message to Dr. Servando Salina.

## 2021-05-05 NOTE — Telephone Encounter (Signed)
Spoke with Dylan Page per DPR. Dylan Page states that the pt's co-pay card is not working and they cannot afford the [employer]. Dylan Page states that [employer] company told her that there are cheaper medications that the patient can be on but that she needed to talk to his doctor. Dylan Page also states that the nephrologist told her that she felt he was on to many medications and it is affecting his kidneys. Dylan Page is in tears and states "I just want to keep my husband alive." Dylan Page also states that she has attempted to call Entresto and Eliquis. Dylan Page also states that they are waiting to hear from pt assistance. We have no samples of Entresto. Dylan Page will come pick up another co-pay card to try.

## 2021-05-05 NOTE — Telephone Encounter (Signed)
Patient calling the office for samples of medication:   1.  What medication and dosage are you requesting samples for? Entresto   2.  Are you currently out of this medication? Yes    

## 2021-05-06 ENCOUNTER — Other Ambulatory Visit: Payer: Self-pay

## 2021-05-06 NOTE — Telephone Encounter (Signed)
Called pt's wife to let her know Dr. Mallory Shirk recommendations. No answer at this time. Will send a MyChart message since unable to leave voice message.

## 2021-05-11 ENCOUNTER — Ambulatory Visit (HOSPITAL_BASED_OUTPATIENT_CLINIC_OR_DEPARTMENT_OTHER): Payer: BC Managed Care – PPO | Attending: Cardiology | Admitting: Cardiovascular Disease

## 2021-05-15 DIAGNOSIS — M7918 Myalgia, other site: Secondary | ICD-10-CM | POA: Diagnosis not present

## 2021-05-15 DIAGNOSIS — M47896 Other spondylosis, lumbar region: Secondary | ICD-10-CM | POA: Diagnosis not present

## 2021-05-15 DIAGNOSIS — M4316 Spondylolisthesis, lumbar region: Secondary | ICD-10-CM | POA: Diagnosis not present

## 2021-05-15 DIAGNOSIS — M545 Low back pain, unspecified: Secondary | ICD-10-CM | POA: Diagnosis not present

## 2021-05-21 ENCOUNTER — Other Ambulatory Visit: Payer: BC Managed Care – PPO

## 2021-05-27 DIAGNOSIS — I129 Hypertensive chronic kidney disease with stage 1 through stage 4 chronic kidney disease, or unspecified chronic kidney disease: Secondary | ICD-10-CM | POA: Diagnosis not present

## 2021-05-27 DIAGNOSIS — I5020 Unspecified systolic (congestive) heart failure: Secondary | ICD-10-CM | POA: Diagnosis not present

## 2021-05-27 DIAGNOSIS — M47816 Spondylosis without myelopathy or radiculopathy, lumbar region: Secondary | ICD-10-CM | POA: Diagnosis not present

## 2021-05-27 DIAGNOSIS — M48061 Spinal stenosis, lumbar region without neurogenic claudication: Secondary | ICD-10-CM | POA: Diagnosis not present

## 2021-05-27 DIAGNOSIS — Z135 Encounter for screening for eye and ear disorders: Secondary | ICD-10-CM | POA: Diagnosis not present

## 2021-05-27 DIAGNOSIS — M5136 Other intervertebral disc degeneration, lumbar region: Secondary | ICD-10-CM | POA: Diagnosis not present

## 2021-05-27 DIAGNOSIS — M4316 Spondylolisthesis, lumbar region: Secondary | ICD-10-CM | POA: Diagnosis not present

## 2021-05-27 DIAGNOSIS — I4891 Unspecified atrial fibrillation: Secondary | ICD-10-CM | POA: Diagnosis not present

## 2021-05-27 DIAGNOSIS — N1831 Chronic kidney disease, stage 3a: Secondary | ICD-10-CM | POA: Diagnosis not present

## 2021-05-28 ENCOUNTER — Other Ambulatory Visit: Payer: Self-pay | Admitting: Internal Medicine

## 2021-05-28 DIAGNOSIS — I129 Hypertensive chronic kidney disease with stage 1 through stage 4 chronic kidney disease, or unspecified chronic kidney disease: Secondary | ICD-10-CM

## 2021-05-28 DIAGNOSIS — N1831 Chronic kidney disease, stage 3a: Secondary | ICD-10-CM

## 2021-05-29 DIAGNOSIS — M48062 Spinal stenosis, lumbar region with neurogenic claudication: Secondary | ICD-10-CM | POA: Diagnosis not present

## 2021-05-29 DIAGNOSIS — M4316 Spondylolisthesis, lumbar region: Secondary | ICD-10-CM | POA: Diagnosis not present

## 2021-06-02 DIAGNOSIS — M48062 Spinal stenosis, lumbar region with neurogenic claudication: Secondary | ICD-10-CM | POA: Diagnosis not present

## 2021-06-05 ENCOUNTER — Ambulatory Visit (INDEPENDENT_AMBULATORY_CARE_PROVIDER_SITE_OTHER): Payer: BC Managed Care – PPO

## 2021-06-05 ENCOUNTER — Other Ambulatory Visit: Payer: Self-pay

## 2021-06-05 ENCOUNTER — Telehealth: Payer: Self-pay | Admitting: *Deleted

## 2021-06-05 DIAGNOSIS — I719 Aortic aneurysm of unspecified site, without rupture: Secondary | ICD-10-CM

## 2021-06-05 NOTE — Telephone Encounter (Signed)
Pt came in for U/S and requested samples of Farxiga. Gave pt 2 boxes of Farxiga 10mg  per Dr. .

## 2021-06-06 NOTE — Telephone Encounter (Signed)
Please advise 

## 2021-06-06 NOTE — Telephone Encounter (Signed)
Called pt's wife. See chart.  

## 2021-06-09 ENCOUNTER — Ambulatory Visit
Admission: RE | Admit: 2021-06-09 | Discharge: 2021-06-09 | Disposition: A | Discharge status: Home / Self Care | Payer: BC Managed Care – PPO | Source: Ambulatory Visit | Attending: Internal Medicine | Admitting: Internal Medicine

## 2021-06-09 DIAGNOSIS — N189 Chronic kidney disease, unspecified: Secondary | ICD-10-CM | POA: Diagnosis not present

## 2021-06-09 DIAGNOSIS — I129 Hypertensive chronic kidney disease with stage 1 through stage 4 chronic kidney disease, or unspecified chronic kidney disease: Secondary | ICD-10-CM

## 2021-06-09 DIAGNOSIS — N1831 Chronic kidney disease, stage 3a: Secondary | ICD-10-CM

## 2021-06-24 ENCOUNTER — Other Ambulatory Visit: Payer: Self-pay

## 2021-06-24 ENCOUNTER — Telehealth: Payer: Self-pay | Admitting: Cardiology

## 2021-06-24 ENCOUNTER — Encounter: Payer: Self-pay | Admitting: Cardiology

## 2021-06-24 ENCOUNTER — Ambulatory Visit (INDEPENDENT_AMBULATORY_CARE_PROVIDER_SITE_OTHER): Payer: BC Managed Care – PPO | Admitting: Cardiology

## 2021-06-24 VITALS — BP 118/80 | HR 63 | Ht 70.0 in | Wt 271.0 lb

## 2021-06-24 DIAGNOSIS — R0989 Other specified symptoms and signs involving the circulatory and respiratory systems: Secondary | ICD-10-CM | POA: Diagnosis not present

## 2021-06-24 DIAGNOSIS — I10 Essential (primary) hypertension: Secondary | ICD-10-CM

## 2021-06-24 DIAGNOSIS — I480 Paroxysmal atrial fibrillation: Secondary | ICD-10-CM | POA: Diagnosis not present

## 2021-06-24 DIAGNOSIS — I5020 Unspecified systolic (congestive) heart failure: Secondary | ICD-10-CM

## 2021-06-24 DIAGNOSIS — E669 Obesity, unspecified: Secondary | ICD-10-CM

## 2021-06-24 NOTE — Patient Instructions (Addendum)
Medication Instructions:  Your physician recommends that you continue on your current medications as directed. Please refer to the Current Medication list given to you today.  *If you need a refill on your cardiac medications before your next appointment, please call your pharmacy*   Lab Work: None If you have labs (blood work) drawn today and your tests are completely normal, you will receive your results only by: MyChart Message (if you have MyChart) OR A paper copy in the mail If you have any lab test that is abnormal or we need to change your treatment, we will call you to review the results.   Testing/Procedures: None   Follow-Up: At [facility], you and your health needs are our priority.  As part of our continuing mission to provide you with exceptional heart care, we have created designated Provider Care Teams.  These Care Teams include your primary Cardiologist (physician) and Advanced Practice Providers (APPs -  Physician Assistants and Nurse Practitioners) who all work together to provide you with the care you need, when you need it.  We recommend signing up for the patient portal called "MyChart".  Sign up information is provided on this After Visit Summary.  MyChart is used to connect with patients for Virtual Visits (Telemedicine).  Patients are able to view lab/test results, encounter notes, upcoming appointments, etc.  Non-urgent messages can be sent to your provider as well.   To learn more about what you can do with MyChart, go to [email or web address].    Your next appointment:   6 month(s)  The format for your next appointment:   In Person  Provider:   Thomasene Ripple, DO     Other Instructions  Be sure to ask your Nephrologist about Jardiance. Please reach out and let us know if you decide to change it.

## 2021-06-24 NOTE — Progress Notes (Signed)
Cardiology Office Note:    Date:  06/24/2021   ID:  Dylan Page, DOB Mar 16, 1976, MRN [id number]  PCP:  Buckner Malta, MD  Cardiologist:  Thomasene Ripple, DO  Electrophysiologist:  None   Referring MD: Buckner Malta, MD    " I am doing well"  History of Present Illness:    Dylan Page is a 45 y.o. male with a hx of  paroxysmal  atrial fibrillation on metoprolol succinate as well as Eliquis, hypertension, recently diagnosed depressed ejection fraction on echocardiogram done in March 2022 showed EF of 30 to 35%,.   At his initial visit he reported that he post admission to [facility] on October 11, 2020 he went to see his PCP he was found to be atrial fibrillation with rapid ventricular rate he was then asked to go to the emergency department.  While in the hospital the patient was started on rate control agents.  And subsequently had an echocardiogram which showed a EF of 30 to 35% with global hypokinesis, mild to moderate mitral regurgitation.   At the conclusion of that visit the patient reported that he had worn a ZIO monitor.  He was in sinus rhythm that day I continued the patient on his Eliquis as well as his metoprolol succinate 150 mg daily.  For his dilated cardiomyopathy I stopped his lisinopril and transition the patient to [facility].  I also started patient on Aldactone 12.5 mg daily.  I recommended the patient get a sleep study and a sleep study was ordered.  I referred the patient to our advanced heart failure but unfortunately he was unable to schedule a visit.   I saw the patient on Dec 12, 2020 at that time I continued his Aldactone and Entresto as well as added his [place].  He was on Toprol-XL we will continue that medication.  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  He wore a ZIO monitor with did not show any evidence of  arrhythmia.  I saw the patient on April 01, 2021 he requested to be seen given the fact that he was experiencing low heart rate in more fluid retention.  I saw the patient and during that visit he also noted to me that he had been having significant back pain.  Conclusion of visit the patient was going to set up his echocardiogram for [place] to reassess his LV function.  He had responded favorable to the increasing his Lasix that was transient I therefore continue the medication.  He also showed improvement on the calling back of his beta-blocker.  He still has not scheduled his sleep study during that visit. Since I saw the patient he has had a visit with the nephrologist given the fact that there was concern of his elevated creatinine.  He has been reassured and was encouraged to continue his current medication regimen.  He offers no specific cardiovascular complaint today.  But tells me that he has had significant back pain.  His wife who is with him in the office tells me that he has been having some trouble with affording his [place].  If also have issues with the Eliquis.    Past Medical History:  Diagnosis Date   ADHD    Atrial fibrillation with RVR (HCC) 10/11/2020   Blind left eye    Depressed left ventricular ejection fraction 11/14/2020   Fatigue 11/14/2020   Generalized  anxiety disorder 10/12/2020   GERD (gastroesophageal reflux disease)    HFrEF (heart failure with reduced ejection fraction) ([place]) 11/14/2020   Hypertension    Insomnia    Obesity (BMI 30-39.9) 11/14/2020   PAF (paroxysmal atrial fibrillation) ([place]) 11/14/2020   Primary hypertension 10/12/2020   Snoring 11/14/2020    Past Surgical History:  Procedure Laterality Date   ANKLE SURGERY Left 2003   EYE SURGERY     RIGHT/LEFT HEART CATH AND CORONARY ANGIOGRAPHY N/A 12/26/2020   Procedure: RIGHT/LEFT HEART CATH AND CORONARY ANGIOGRAPHY;  Surgeon: Martinique, Peter M, MD;  Location: [facility];  Service: Cardiovascular;   Laterality: N/A;    Current Medications: Current Meds  Medication Sig   acetaminophen (TYLENOL) 500 MG tablet Take 1,000 mg by mouth every 8 (eight) hours as needed for moderate pain.   alprazolam (XANAX) 2 MG tablet Take 2 mg by mouth at bedtime as needed for sleep.   apixaban (ELIQUIS) 5 MG TABS tablet Take 1 tablet (5 mg total) by mouth 2 (two) times daily.   cetirizine (ZYRTEC) 10 MG tablet Take 10 mg by mouth daily as needed for allergies.   dapagliflozin propanediol (FARXIGA) 10 MG TABS tablet Take 1 tablet (10 mg total) by mouth daily before breakfast.   fluconazole (DIFLUCAN) 150 MG tablet Take 150 mg by mouth every 3 (three) days.   furosemide (LASIX) 40 MG tablet Take 1 tablet (40 mg total) by mouth 2 (two) times daily.   metoprolol succinate (TOPROL-XL) 50 MG 24 hr tablet Take 50 mg by mouth 2 (two) times daily.   sacubitril-valsartan (ENTRESTO) 24-26 MG Take 1 tablet by mouth 2 (two) times daily.   tiZANidine (ZANAFLEX) 4 MG tablet Take 4 mg by mouth 3 (three) times daily as needed.   traZODone (DESYREL) 100 MG tablet Take 50 mg by mouth at bedtime. Take 1/2 tablet (50mg ) nightly   triamcinolone ointment (KENALOG) 0.1 % Apply 1 application topically as needed for rash.     Allergies:   Patient has no known allergies.   Social History   Socioeconomic History   Marital status: Married    Spouse name: Not on file   Number of children: Not on file   Years of education: Not on file   Highest education level: Not on file  Occupational History   Not on file  Tobacco Use   Smoking status: Never   Smokeless tobacco: Never  Vaping Use   Vaping Use: Never used  Substance and Sexual Activity   Alcohol use: Yes    Comment: Occasional   Drug use: Never   Sexual activity: Not on file  Other Topics Concern   Not on file  Social History Narrative   Not on file   Social Determinants of Health   Financial Resource Strain: Not on file  Food Insecurity: Not on file   Transportation Needs: Not on file  Physical Activity: Not on file  Stress: Not on file  Social Connections: Not on file     Family History: The patient's family history includes Heart attack in his father; Heart disease in his father.  ROS:   Review of Systems  Constitution: Negative for decreased appetite, fever and weight gain.  HENT: Negative for congestion, ear discharge, hoarse voice and sore throat.   Eyes: Negative for discharge, redness, vision loss in right eye and visual halos.  Cardiovascular: Negative for chest pain, dyspnea on exertion, leg swelling, orthopnea and palpitations.  Respiratory: Negative for cough, hemoptysis,  shortness of breath and snoring.   Endocrine: Negative for heat intolerance and polyphagia.  Hematologic/Lymphatic: Negative for bleeding problem. Does not bruise/bleed easily.  Skin: Negative for flushing, nail changes, rash and suspicious lesions.  Musculoskeletal: Negative for arthritis, joint pain, muscle cramps, myalgias, neck pain and stiffness.  Gastrointestinal: Negative for abdominal pain, bowel incontinence, diarrhea and excessive appetite.  Genitourinary: Negative for decreased libido, genital sores and incomplete emptying.  Neurological: Negative for brief paralysis, focal weakness, headaches and loss of balance.  Psychiatric/Behavioral: Negative for altered mental status, depression and suicidal ideas.  Allergic/Immunologic: Negative for HIV exposure and persistent infections.    EKGs/Labs/Other Studies Reviewed:    The following studies were reviewed today:   EKG:  The ekg ordered today demonstrates sinus rhythm, heart rate 63 bpm.  Echocardiogram September 2022 IMPRESSIONS     1. Left ventricular ejection fraction, by estimation, is 45 to 50%. The  left ventricle has mildly decreased function. The left ventricle has no  regional wall motion abnormalities. There is severe concentric left  ventricular hypertrophy. Left  ventricular   diastolic parameters are consistent with Grade I diastolic dysfunction  (impaired relaxation). The average left ventricular global longitudinal  strain is -10.5 %. The global longitudinal strain is abnormal.   2. Right ventricular systolic function is normal. The right ventricular  size is normal. There is normal pulmonary artery systolic pressure.   3. The mitral valve is normal in structure. No evidence of mitral valve  regurgitation. No evidence of mitral stenosis.   4. The aortic valve is normal in structure. Aortic valve regurgitation is  not visualized. No aortic stenosis is present.   5. 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 Aortic dilatation noted. Aneurysm  of the ascending aorta, measuring 40 mm. There is moderate dilatation of  the aortic root, measuring 39 mm.   6. The inferior vena cava is normal in size with greater than 50%  respiratory variability, suggesting right atrial pressure of 3 mmHg.   FINDINGS   Left Ventricle: Left ventricular ejection fraction, by estimation, is 45  to 50%. The left ventricle has mildly decreased function. The left  ventricle has no regional wall motion abnormalities. The average left  ventricular global longitudinal strain is  -10.5 %. The global longitudinal strain is abnormal. The left ventricular  internal cavity size was normal in size. There is severe concentric left  ventricular hypertrophy. Left ventricular diastolic parameters are  consistent with Grade I diastolic  dysfunction (impaired relaxation).   Right Ventricle: The right ventricular size is normal. No increase in  right ventricular wall thickness. Right ventricular systolic function is  normal. There is normal pulmonary artery systolic pressure. The tricuspid  regurgitant velocity is 2.49 m/s, and   with an assumed right atrial pressure of 3 mmHg, the estimated right  ventricular systolic pressure is 0000000 mmHg.   Left Atrium: Left atrial size was normal in  size.   Right Atrium: Right atrial size was normal in size.   Pericardium: There is no evidence of pericardial effusion.   Mitral Valve: The mitral valve is normal in structure. No evidence of  mitral valve regurgitation. No evidence of mitral valve stenosis.   Tricuspid Valve: The tricuspid valve is normal in structure. Tricuspid  valve regurgitation is trivial. No evidence of tricuspid stenosis.   Aortic Valve: The aortic valve is normal in structure. Aortic valve  regurgitation is not visualized. No aortic stenosis is present.   Pulmonic Valve: The pulmonic valve was normal  in structure. Pulmonic valve  regurgitation is not visualized. No evidence of pulmonic stenosis.   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 Aortic dilatation noted. There  is moderate dilatation of the aortic root, measuring 39 mm. There is an  aneurysm involving the ascending aorta measuring 40 mm.   Venous: The inferior vena cava is normal in size with greater than 50%  respiratory variability, suggesting right atrial pressure of 3 mmHg.   IAS/Shunts: No atrial level shunt detected by color flow Doppler.      Recent Labs: 12/19/2020: Platelets 294 12/26/2020: Hemoglobin 12.2; Hemoglobin 12.6 04/03/2021: ALT 35; BUN 12; Creatinine, Ser 1.40; Magnesium 1.9; Potassium 4.1; Sodium 136  Recent Lipid Panel    Component Value Date/Time   CHOL 131 04/03/2021 1546   TRIG 51 04/03/2021 1546   HDL 40 04/03/2021 1546   CHOLHDL 3.3 04/03/2021 1546   LDLCALC 80 04/03/2021 1546    Physical Exam:    VS:  BP 118/80 (BP Location: Right Arm)   Pulse 63   Ht 5\' 10"  (1.778 m)   Wt 271 lb (122.9 kg)   SpO2 96%   BMI 38.88 kg/m     Wt Readings from Last 3 Encounters:  06/24/21 271 lb (122.9 kg)  04/01/21 275 lb 12.8 oz (125.1 kg)  12/26/20 275 lb (124.7 kg)     GEN: Well nourished, well developed in no acute distress HEENT: Normal NECK: No JVD; No carotid bruits LYMPHATICS: No lymphadenopathy CARDIAC:  S1S2 noted,RRR, no murmurs, rubs, gallops RESPIRATORY:  Clear to auscultation without rales, wheezing or rhonchi  ABDOMEN: Soft, non-tender, non-distended, +bowel sounds, no guarding. EXTREMITIES: No edema, No cyanosis, no clubbing MUSCULOSKELETAL:  No deformity  SKIN: Warm and dry NEUROLOGIC:  Alert and oriented x 3, non-focal PSYCHIATRIC:  Normal affect, good insight  ASSESSMENT:    1. Depressed left ventricular ejection fraction   2. PAF (paroxysmal atrial fibrillation) ([place])   3. Primary hypertension   4. HFrEF (heart failure with reduced ejection fraction) (HCC)   5. Obesity (BMI 30-39.9)    PLAN:    His heart function has improved significantly, his most recent EF is 45 to 50%.  I have since taken the patient off his spironolactone.  For now we will continue his current medication regimen.  His SGLT2 inhibitor has been also encouraged by the nephrologist so we will continue this for now.  But there is an issue with affordability of this medication.  I have discussed multiple option with the patient and his wife which includes filling out a form for assistance for the pharmaceutical company, transitioning from [place] to [place].  They plan to talk with nephrology to make sure this is okay as well as discussed with the insurance company for affordability of this medication. Continue his low-dose beta-blocker anticoagulation in terms of the paroxysmal atrial fibrillation for now. He still needs a sleep study but the patient prefers not to pursue this. The patient understands the need to lose weight with diet and exercise. We have discussed specific strategies for this.  The patient is in agreement with the above plan. The patient left the office in stable condition.  The patient will follow up in 6 months or sooner if needed.  Medication Adjustments/Labs and Tests Ordered: Current medicines are reviewed at length with the patient today.  Concerns regarding medicines are outlined  above.  Orders Placed This Encounter  Procedures   EKG 12-Lead   No orders of the defined types were placed in  this encounter.   Patient Instructions  Medication Instructions:  Your physician recommends that you continue on your current medications as directed. Please refer to the Current Medication list given to you today.  *If you need a refill on your cardiac medications before your next appointment, please call your pharmacy*   Lab Work: None If you have labs (blood work) drawn today and your tests are completely normal, you will receive your results only by: MyChart Message (if you have MyChart) OR A paper copy in the mail If you have any lab test that is abnormal or we need to change your treatment, we will call you to review the results.   Testing/Procedures: None   Follow-Up: At [facility], you and your health needs are our priority.  As part of our continuing mission to provide you with exceptional heart care, we have created designated Provider Care Teams.  These Care Teams include your primary Cardiologist (physician) and Advanced Practice Providers (APPs -  Physician Assistants and Nurse Practitioners) who all work together to provide you with the care you need, when you need it.  We recommend signing up for the patient portal called "MyChart".  Sign up information is provided on this After Visit Summary.  MyChart is used to connect with patients for Virtual Visits (Telemedicine).  Patients are able to view lab/test results, encounter notes, upcoming appointments, etc.  Non-urgent messages can be sent to your provider as well.   To learn more about what you can do with MyChart, go to [email or web address].    Your next appointment:   6 month(s)  The format for your next appointment:   In Person  Provider:   Thomasene Ripple, DO     Other Instructions  Be sure to ask your Nephrologist about [place]. Please reach out and let us know if you decide to change it.     Adopting a Healthy Lifestyle.  Know what a healthy weight is for you (roughly BMI <25) and aim to maintain this   Aim for 7+ servings of fruits and vegetables daily   65-80+ fluid ounces of water or unsweet tea for healthy kidneys   Limit to max 1 drink of alcohol per day; avoid smoking/tobacco   Limit animal fats in diet for cholesterol and heart health - choose grass fed whenever available   Avoid highly processed foods, and foods high in saturated/trans fats   Aim for low stress - take time to unwind and care for your mental health   Aim for 150 min of moderate intensity exercise weekly for heart health, and weights twice weekly for bone health   Aim for 7-9 hours of sleep daily   When it comes to diets, agreement about the perfect plan isnt easy to find, even among the experts. Experts at the [facility] [employer] developed an idea known as the Healthy Eating Plate. Just imagine a plate divided into logical, healthy portions.   The emphasis is on diet quality:   Load up on vegetables and fruits - one-half of your plate: Aim for color and variety, and remember that potatoes dont count.   Go for whole grains - one-quarter of your plate: Whole wheat, barley, wheat berries, quinoa, oats, brown rice, and foods made with them. If you want pasta, go with whole wheat pasta.   Protein power - one-quarter of your plate: Fish, chicken, beans, and nuts are all healthy, versatile protein sources. Limit red meat.   The diet, however, does go  beyond the plate, offering a few other suggestions.   Use healthy plant oils, such as olive, canola, soy, corn, sunflower and peanut. Check the labels, and avoid partially hydrogenated oil, which have unhealthy trans fats.   If youre thirsty, drink water. Coffee and tea are good in moderation, but skip sugary drinks and limit milk and dairy products to one or two daily servings.   The type of carbohydrate in the diet is more important  than the amount. Some sources of carbohydrates, such as vegetables, fruits, whole grains, and beans-are healthier than others.   Finally, stay active  Signed, Berniece Salines, DO  06/24/2021 8:59 PM    [facility]

## 2021-06-24 NOTE — Telephone Encounter (Signed)
Spoke with [name], informed me Dr. Servando Salina is planning to change Dylan Page however, because of his kidney disease, patient will need to check with his Urologist to see if new med  is safe for his kidney's. In the meantime I will provide him with 7 days worth until he can obtain this information. [name] is aware come to the front desk when ready for pick up

## 2021-06-24 NOTE — Telephone Encounter (Signed)
Patient calling the office for samples of medication:   1.  What medication and dosage are you requesting samples for? dapagliflozin propanediol (FARXIGA) 10 MG TABS tablet  2.  Are you currently out of this medication? Yes   Please don't reroute to NL pt states that Dr. Servando Salina advised to check [place] location for samples.

## 2021-07-14 ENCOUNTER — Telehealth: Payer: Self-pay

## 2021-07-14 NOTE — Telephone Encounter (Signed)
Call us if you are still interested in completing your sleep study. Your order will expire in 30 days of this letter. If we have not heard from you within this time frame you will need to discuss this further with your provider at your next office visit.  Sincerely,  [facility]                                                                                                      

## 2021-07-18 DIAGNOSIS — F4381 Prolonged grief disorder: Secondary | ICD-10-CM | POA: Diagnosis not present

## 2021-07-18 DIAGNOSIS — I4891 Unspecified atrial fibrillation: Secondary | ICD-10-CM | POA: Diagnosis not present

## 2021-07-18 DIAGNOSIS — I10 Essential (primary) hypertension: Secondary | ICD-10-CM | POA: Diagnosis not present

## 2021-07-18 DIAGNOSIS — I5020 Unspecified systolic (congestive) heart failure: Secondary | ICD-10-CM | POA: Diagnosis not present

## 2021-08-05 ENCOUNTER — Encounter: Payer: Self-pay | Admitting: Cardiology

## 2021-08-05 MED ORDER — APIXABAN 5 MG PO TABS: 5.0000 mg | ORAL_TABLET | Freq: Two times a day (BID) | ORAL | 2 refills | Status: AC

## 2021-08-13 DIAGNOSIS — M47896 Other spondylosis, lumbar region: Secondary | ICD-10-CM | POA: Diagnosis not present

## 2021-08-13 DIAGNOSIS — Z79891 Long term (current) use of opiate analgesic: Secondary | ICD-10-CM | POA: Diagnosis not present

## 2021-08-13 DIAGNOSIS — M48062 Spinal stenosis, lumbar region with neurogenic claudication: Secondary | ICD-10-CM | POA: Diagnosis not present

## 2021-08-13 DIAGNOSIS — Z79899 Other long term (current) drug therapy: Secondary | ICD-10-CM | POA: Diagnosis not present

## 2021-08-13 DIAGNOSIS — M4316 Spondylolisthesis, lumbar region: Secondary | ICD-10-CM | POA: Diagnosis not present

## 2021-08-13 DIAGNOSIS — G894 Chronic pain syndrome: Secondary | ICD-10-CM | POA: Diagnosis not present

## 2021-08-20 DIAGNOSIS — M47816 Spondylosis without myelopathy or radiculopathy, lumbar region: Secondary | ICD-10-CM | POA: Diagnosis not present

## 2021-08-31 ENCOUNTER — Other Ambulatory Visit: Payer: Self-pay | Admitting: Cardiology

## 2021-09-17 DIAGNOSIS — M47816 Spondylosis without myelopathy or radiculopathy, lumbar region: Secondary | ICD-10-CM | POA: Diagnosis not present

## 2021-09-30 ENCOUNTER — Other Ambulatory Visit: Payer: Self-pay | Admitting: Cardiology

## 2022-05-19 ENCOUNTER — Other Ambulatory Visit: Payer: Self-pay | Admitting: Cardiology

## 2023-11-05 IMAGING — US US RENAL
1 series · 14 of 25 positions shown · non-contrast
Comparison: CT of the abdomen pelvis dated 03/12/2014.

CLINICAL DATA: Chronic kidney disease.  Hypertension.

EXAM:
RENAL / URINARY TRACT ULTRASOUND COMPLETE

[Series 1: us renal · 0.28mm/px · 14 of 41 slices shown]
[im 1/41]
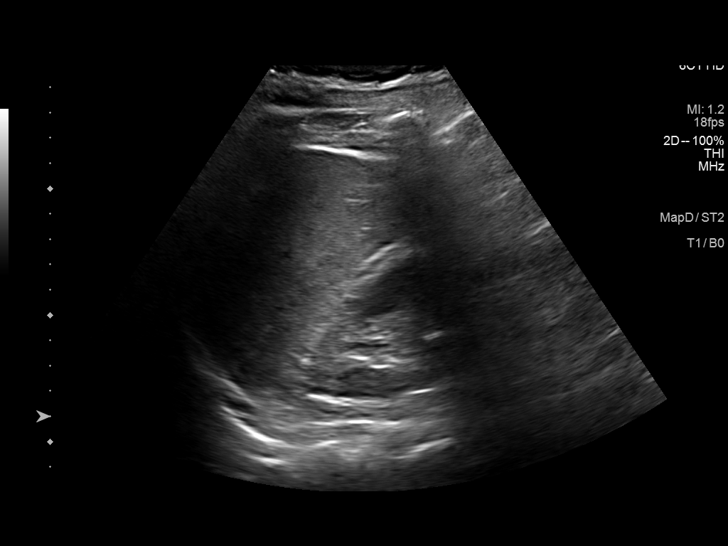
[im 4/41]
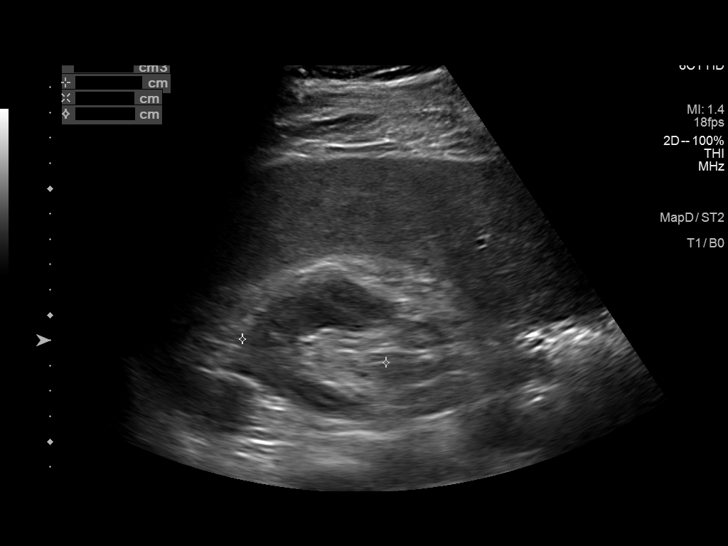
[im 7/41]
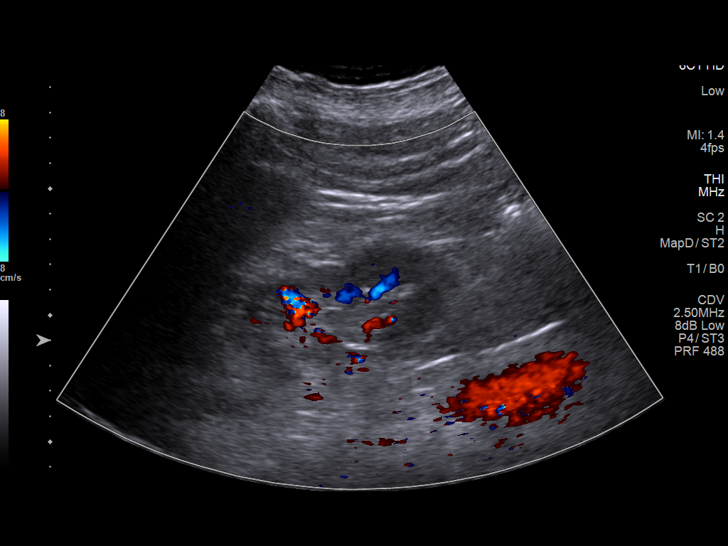
[im 11/41]
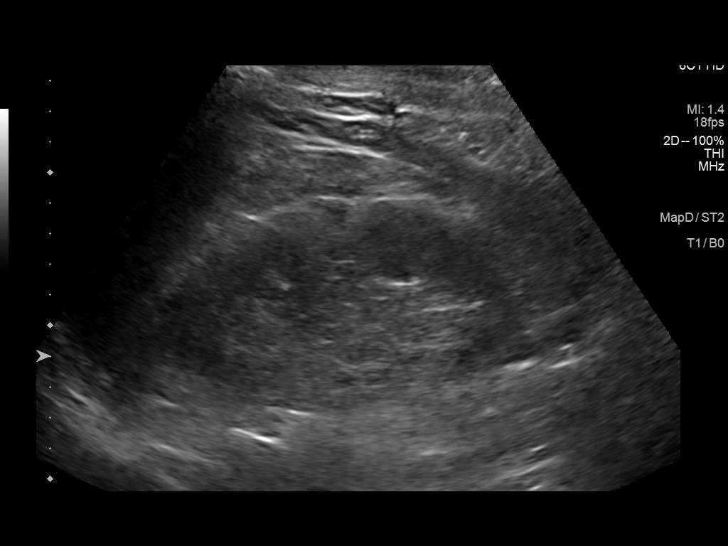
[im 14/41]
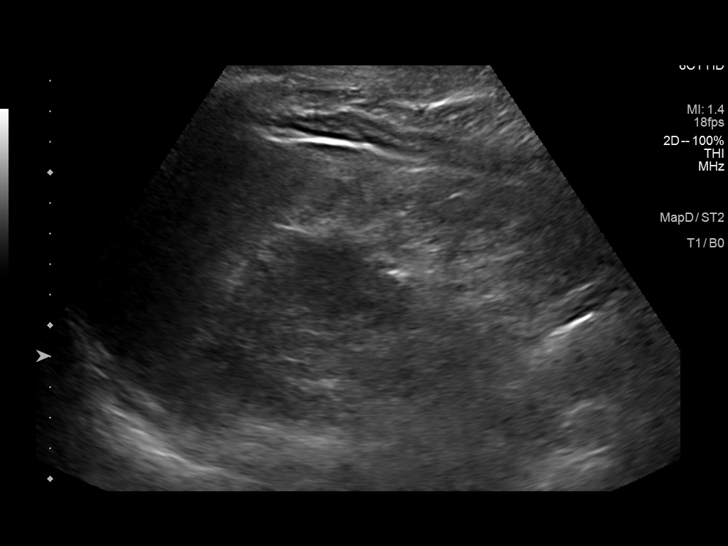
[im 16/41]
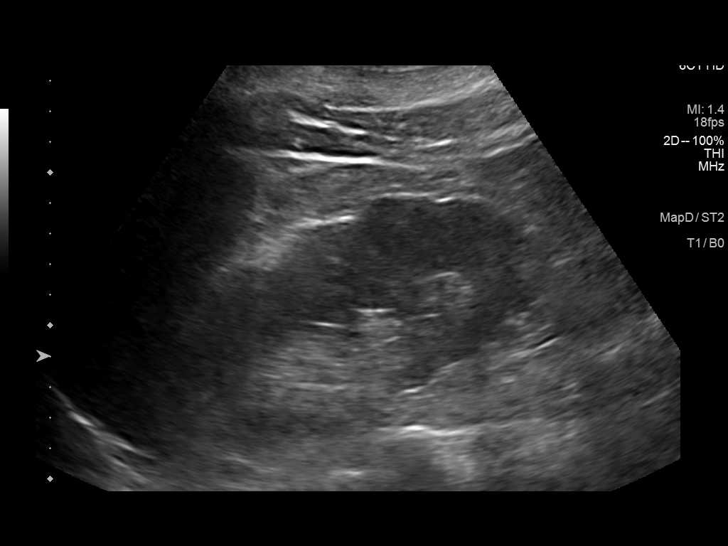
[im 19/41]
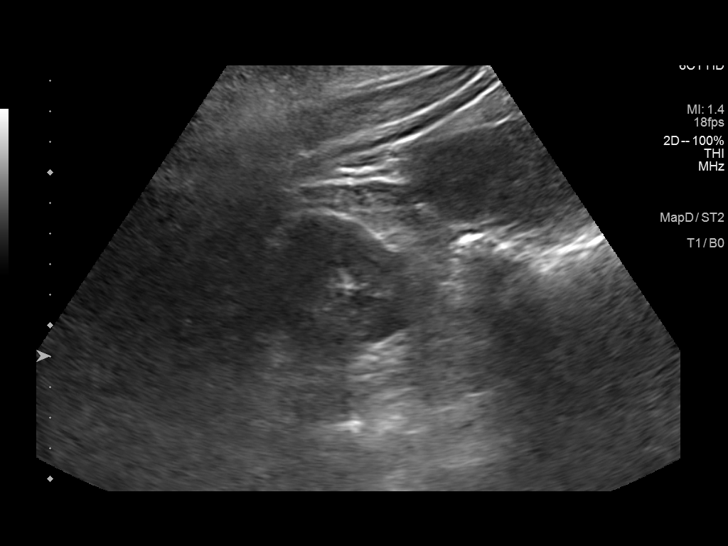
[im 22/41]
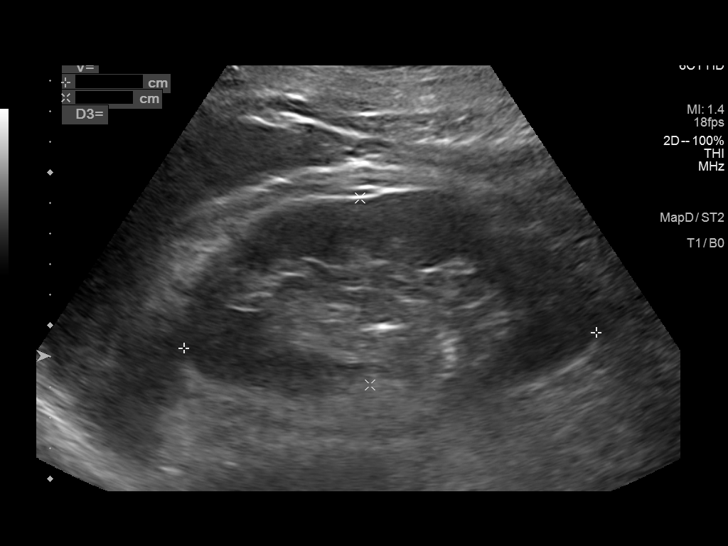
[im 26/41]
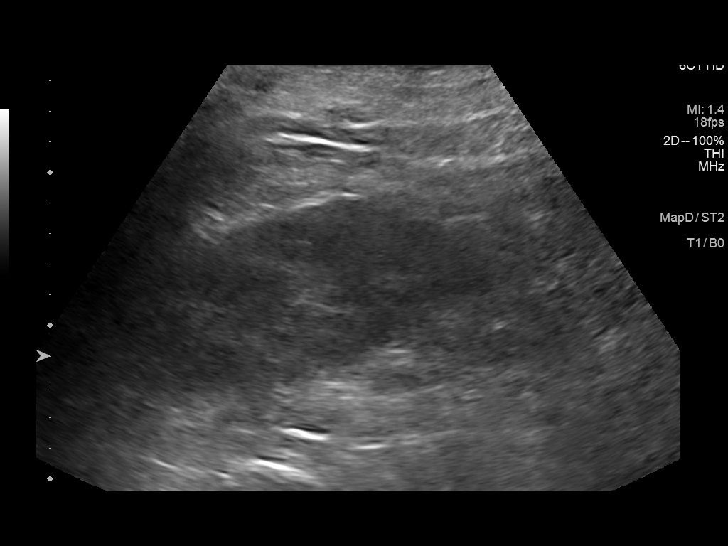
[im 27/41]
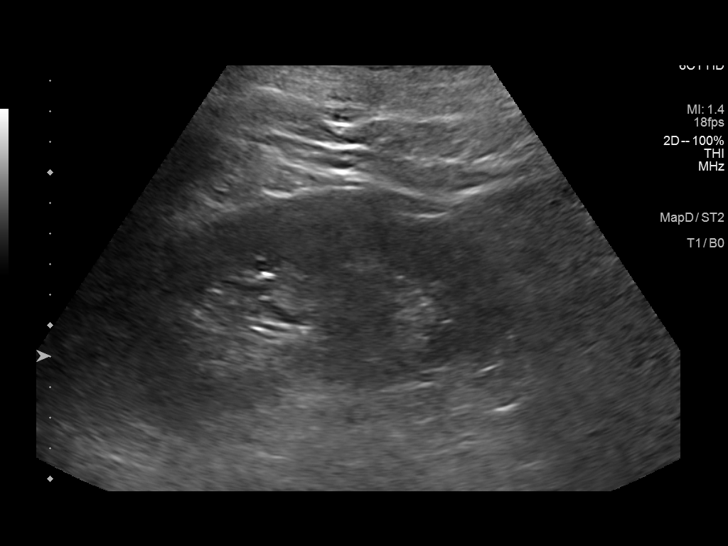
[im 31/41]
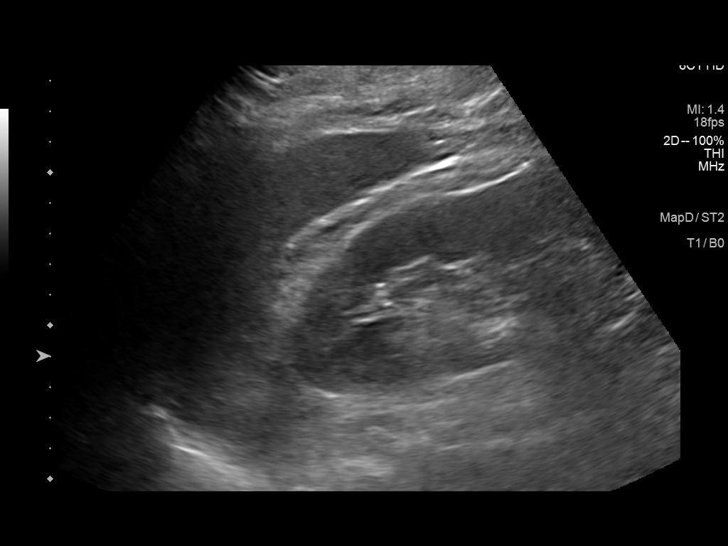
[im 34/41]
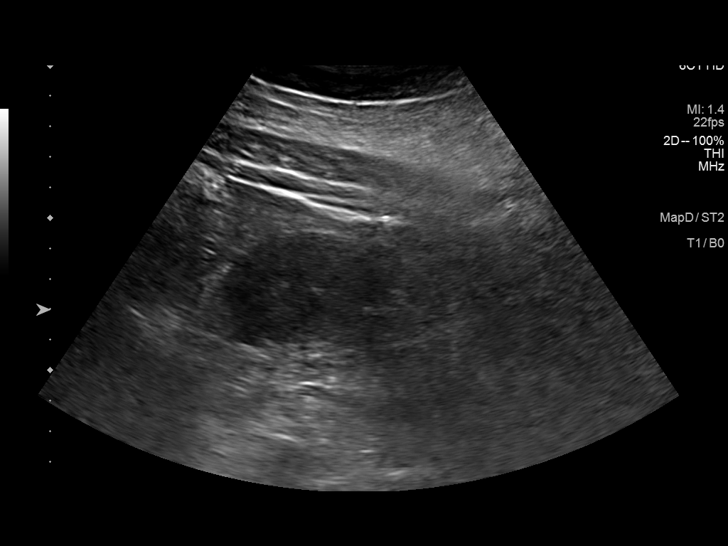
[im 37/41]
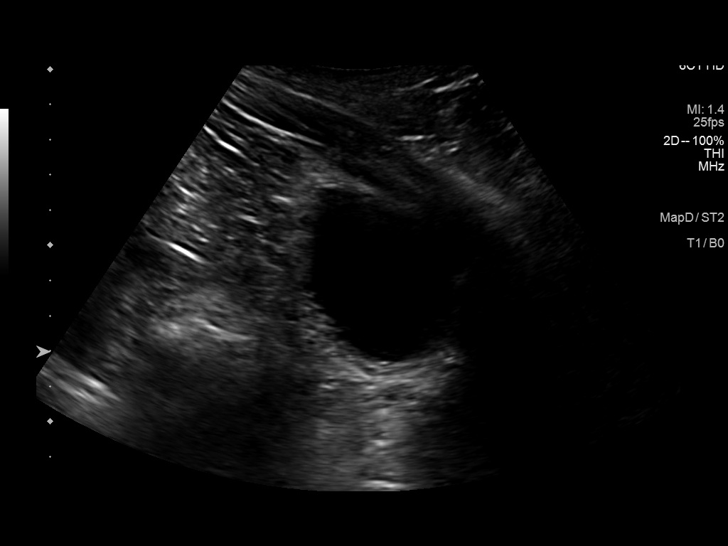
[im 41/41]
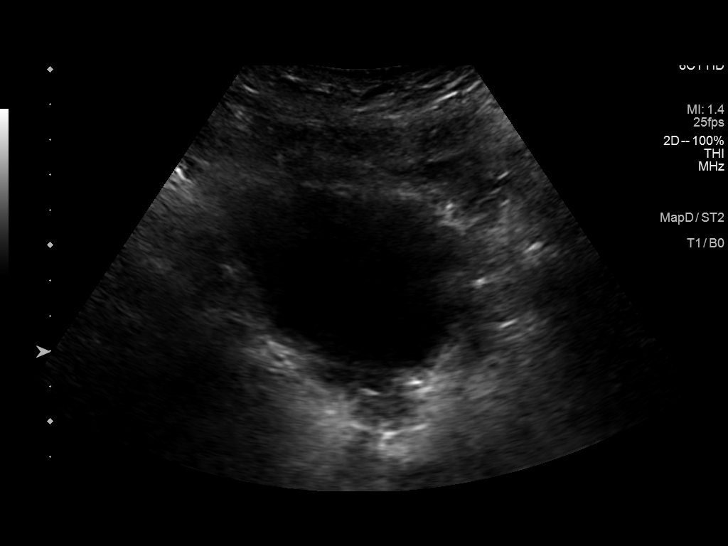

[14 of 25 positions shown; findings below may reference images not displayed]

FINDINGS: Right Kidney:

Renal measurements: 11.1 x 5.9 x 5.8 cm = volume: 197 mL. Normal
echogenicity. No hydronephrosis or shadowing stone.

Left Kidney:

Renal measurements: 13.5 x 6.1 x 5.8 cm = volume: 205 mL. The normal
echogenicity. No hydronephrosis or shadowing stone.

Bladder:

Appears normal for degree of bladder distention.

Other:

None.
IMPRESSION: Unremarkable renal ultrasound.
